# Patient Record
Sex: Female | Born: 1980 | Race: White | Hispanic: No | Marital: Married | Smoking: Never smoker
Health system: Southern US, Community
[De-identification: ages and names within clinical notes are randomized; demographics above are authoritative.]

## PROBLEM LIST (undated history)

## (undated) DIAGNOSIS — Z789 Other specified health status: Secondary | ICD-10-CM

---

## 2019-01-22 ENCOUNTER — Emergency Department (HOSPITAL_COMMUNITY): Payer: Self-pay

## 2019-01-22 ENCOUNTER — Encounter (HOSPITAL_COMMUNITY): Payer: Self-pay

## 2019-01-22 ENCOUNTER — Emergency Department (HOSPITAL_COMMUNITY)
Admission: EM | Admit: 2019-01-22 | Discharge: 2019-01-22 | Disposition: A | Discharge status: Home / Self Care | Payer: Self-pay | Attending: Emergency Medicine | Admitting: Emergency Medicine

## 2019-01-22 ENCOUNTER — Other Ambulatory Visit: Payer: Self-pay

## 2019-01-22 DIAGNOSIS — O034 Incomplete spontaneous abortion without complication: Secondary | ICD-10-CM | POA: Insufficient documentation

## 2019-01-22 DIAGNOSIS — Z3A08 8 weeks gestation of pregnancy: Secondary | ICD-10-CM | POA: Insufficient documentation

## 2019-01-22 DIAGNOSIS — O4690 Antepartum hemorrhage, unspecified, unspecified trimester: Secondary | ICD-10-CM

## 2019-01-22 DIAGNOSIS — R109 Unspecified abdominal pain: Secondary | ICD-10-CM

## 2019-01-22 LAB — CBC WITH DIFFERENTIAL/PLATELET
Abs Immature Granulocytes: 0.01 10*3/uL (ref 0.00–0.07)
Basophils Absolute: 0 10*3/uL (ref 0.0–0.1)
Basophils Relative: 0 %
Eosinophils Absolute: 0.1 10*3/uL (ref 0.0–0.5)
Eosinophils Relative: 3 %
HCT: 38 % (ref 36.0–46.0)
Hemoglobin: 12.2 g/dL (ref 12.0–15.0)
Immature Granulocytes: 0 %
Lymphocytes Relative: 28 %
Lymphs Abs: 1.4 10*3/uL (ref 0.7–4.0)
MCH: 26.3 pg (ref 26.0–34.0)
MCHC: 32.1 g/dL (ref 30.0–36.0)
MCV: 82.1 fL (ref 80.0–100.0)
Monocytes Absolute: 0.3 10*3/uL (ref 0.1–1.0)
Monocytes Relative: 7 %
Neutro Abs: 3 10*3/uL (ref 1.7–7.7)
Neutrophils Relative %: 62 %
Platelets: 239 10*3/uL (ref 150–400)
RBC: 4.63 MIL/uL (ref 3.87–5.11)
RDW: 13.8 % (ref 11.5–15.5)
WBC: 4.9 10*3/uL (ref 4.0–10.5)
nRBC: 0 % (ref 0.0–0.2)

## 2019-01-22 LAB — ABO/RH: ABO/RH(D): O POS

## 2019-01-22 LAB — I-STAT BETA HCG BLOOD, ED (MC, WL, AP ONLY): I-stat hCG, quantitative: 1204.3 m[IU]/mL — ABNORMAL HIGH (ref <5)

## 2019-01-22 NOTE — ED Notes (Signed)
Called pt spouse to come to pt room.

## 2019-01-22 NOTE — ED Provider Notes (Signed)
Patient's pelvic ultrasound showed evidence of fetal demise.  The risk was communicated to the patient via her husband who translated.  That was the patient's preference.  Teach back was used to ensure understanding.  Return precautions given   Lorre Nick, MD 01/22/19 [phone]

## 2019-01-22 NOTE — ED Provider Notes (Signed)
[facility]-EMERGENCY DEPT Provider Note   CSN: [id number] Arrival date & time: 01/22/19  1323    History   Chief Complaint Chief Complaint  Patient presents with  . Vaginal Bleeding  . 2.5 months pregnant    HPI Bonnie Koch is a 38 y.o. female.     Patient is a G7, P6 presenting today with vaginal bleeding that started yesterday in the setting of being 2-1/2 months pregnant.  Patient states the bleeding started spontaneously and has gradually worsened.  She is having abdominal pain and cramping that radiates to her back.  She denies any vaginal discharge and has had the same partner consistently.  She is never had a miscarriage or ectopic pregnancy before.  She has had 3 C-sections and 3 vaginal deliveries.  Her second birth ended in the infant dying shortly after birth but every other pregnancy has gone to term and been normal.  Patient does not take any medications and has followed up with her doctor and done an intake interview but has not had a full evaluation yet.  The history is provided by the patient.  Vaginal Bleeding  Quality:  Dark red and clots Severity:  Moderate Onset quality:  Gradual Duration:  24 hours Timing:  Constant Progression:  Worsening Chronicity:  New Menstrual history:  Regular Possible pregnancy: yes   Context: spontaneously   Relieved by:  None tried Worsened by:  Nothing Ineffective treatments:  None tried Associated symptoms: abdominal pain, back pain and dizziness   Associated symptoms: no dysuria, no nausea and no vaginal discharge   Risk factors: unprotected sex   Risk factors: no hx of ectopic pregnancy, does not have multiple partners and no prior miscarriage     History reviewed. No pertinent past medical history.  There are no active problems to display for this patient.   Past Surgical History:  Procedure Laterality Date  . CESAREAN SECTION     x 3     OB History    Gravida  1   Para      Term       Preterm      AB      Living        SAB      TAB      Ectopic      Multiple      Live Births               Home Medications    Prior to Admission medications   Not on File    Family History Family History  Problem Relation Age of Onset  . Diabetes Mother     Social History Social History   Tobacco Use  . Smoking status: Never Smoker  . Smokeless tobacco: Never Used  Substance Use Topics  . Alcohol use: Never    Frequency: Never  . Drug use: Never     Allergies   Patient has no known allergies.   Review of Systems Review of Systems  Gastrointestinal: Positive for abdominal pain. Negative for nausea.  Genitourinary: Positive for vaginal bleeding. Negative for dysuria and vaginal discharge.  Musculoskeletal: Positive for back pain.  Neurological: Positive for dizziness.  All other systems reviewed and are negative.    Physical Exam Updated Vital Signs BP 125/67 (BP Location: Right Arm)   Pulse 89   Temp 98.2 F (36.8 C) (Oral)   Resp 16   SpO2 100%   Physical Exam Vitals signs and nursing note reviewed.  Constitutional:      General: She is not in acute distress.    Appearance: She is well-developed.  HENT:     Head: Normocephalic and atraumatic.  Eyes:     Pupils: Pupils are equal, round, and reactive to light.  Cardiovascular:     Rate and Rhythm: Normal rate and regular rhythm.     Heart sounds: Normal heart sounds. No murmur. No friction rub.  Pulmonary:     Effort: Pulmonary effort is normal.     Breath sounds: Normal breath sounds. No wheezing or rales.  Abdominal:     General: Bowel sounds are normal. There is no distension.     Palpations: Abdomen is soft.     Tenderness: There is abdominal tenderness in the suprapubic area. There is no right CVA tenderness, left CVA tenderness, guarding or rebound.  Genitourinary:    Vagina: Bleeding present.     Cervix: Cervical bleeding present.     Uterus: Enlarged.       Adnexa: Right adnexa normal and left adnexa normal.     Comments: Os is closed Musculoskeletal: Normal range of motion.        General: No tenderness.     Comments: No edema  Skin:    General: Skin is warm and dry.     Findings: No rash.  Neurological:     General: No focal deficit present.     Mental Status: She is alert and oriented to person, place, and time. Mental status is at baseline.     Cranial Nerves: No cranial nerve deficit.  Psychiatric:        Mood and Affect: Mood normal.        Behavior: Behavior normal.        Thought Content: Thought content normal.      ED Treatments / Results  Labs (all labs ordered are listed, but only abnormal results are displayed) Labs Reviewed  CBC WITH DIFFERENTIAL/PLATELET  URINALYSIS, ROUTINE W REFLEX MICROSCOPIC  I-STAT BETA HCG BLOOD, ED ([facility], [facility], [facility] ONLY)  ABO/RH    EKG None  Radiology No results found.  Procedures Procedures (including critical care time)  Medications Ordered in ED Medications - No data to display   Initial Impression / Assessment and Plan / ED Course  I have reviewed the triage vital signs and the nursing notes.  Pertinent labs & imaging results that were available during my care of the patient were reviewed by me and considered in my medical decision making (see chart for details).       Healthy 38 year old female presenting today with vaginal bleeding and abdominal pain in the setting of pregnancy.  She is currently 2-1/2 months pregnant based on her last menses.  She is having some abdominal discomfort and has vaginal bleeding on exam with some mild clots.  She denies any discharge or concern for STI.  Low suspicion for ovarian torsion.  Need to rule out ectopic versus missed AB or threatened miscarriage.  Rh factor is pending.  hCG, CBC and transvaginal ultrasound pending.  Final Clinical Impressions(s) / ED Diagnoses   Final diagnoses:  None    ED Discharge Orders    None        Gwyneth SproutPlunkett, Ferne Ellingwood, MD 01/22/19 2001

## 2019-01-22 NOTE — ED Notes (Signed)
PT is alert and oriented x 4 and is verbally responsive. Pr is guarding to left side lower abdomen with intermittent moaning. Pt speaks arabic. WALLY in room for interpretation.

## 2019-01-22 NOTE — ED Triage Notes (Signed)
Patient c/o vaginal bleeding, abdominal cramping and back pain since 0400 yesterday. Patient states she is 2 1/2 months pregnant

## 2019-01-22 NOTE — Discharge Instructions (Addendum)
Call your gynecologist tomorrow morning and tell them that you have a fetal demise.  Go to [facility] for severe bleeding, cramping, weakness, or any other problems

## 2019-01-23 ENCOUNTER — Inpatient Hospital Stay (HOSPITAL_COMMUNITY)
Admission: AD | Admit: 2019-01-23 | Discharge: 2019-01-23 | Disposition: A | Discharge status: Home / Self Care | Payer: Self-pay | Attending: Obstetrics & Gynecology | Admitting: Obstetrics & Gynecology

## 2019-01-23 ENCOUNTER — Encounter (HOSPITAL_COMMUNITY): Payer: Self-pay

## 2019-01-23 DIAGNOSIS — Z6740 Type O blood, Rh positive: Secondary | ICD-10-CM | POA: Insufficient documentation

## 2019-01-23 DIAGNOSIS — O039 Complete or unspecified spontaneous abortion without complication: Secondary | ICD-10-CM | POA: Insufficient documentation

## 2019-01-23 MED ORDER — MISOPROSTOL 200 MCG PO TABS
600.0000 ug | ORAL_TABLET | Freq: Once | ORAL | Status: AC
  Administered 2019-01-23: 600 ug via BUCCAL
  Filled 2019-01-23: qty 3

## 2019-01-23 MED ORDER — OXYCODONE-ACETAMINOPHEN 5-325 MG PO TABS: 1.0000 | ORAL_TABLET | ORAL | 0 refills | Status: DC | PRN

## 2019-01-23 MED ORDER — KETOROLAC TROMETHAMINE 60 MG/2ML IM SOLN
60.0000 mg | Freq: Once | INTRAMUSCULAR | Status: AC
  Administered 2019-01-23: 14:00:00 60 mg via INTRAMUSCULAR
  Filled 2019-01-23: qty 2

## 2019-01-23 MED ORDER — IBUPROFEN 600 MG PO TABS: 600.0000 mg | ORAL_TABLET | Freq: Four times a day (QID) | ORAL | 0 refills | Status: DC | PRN

## 2019-01-23 NOTE — MAU Note (Signed)
Pt was seen yesterday at [facility] and was told she had a missed AB. Has been bleeding for 3 days. She didn't understand what she was to do for follow up, says that everything has not passed. Is having bleeding, not any heavier than yesterday, passing some clots.  Pain is 6/10.

## 2019-01-23 NOTE — Discharge Instructions (Signed)
Miscarriage  A miscarriage is the loss of an unborn baby (fetus) before the 20th week of pregnancy.  Follow these instructions at home:  Medicines    · Take over-the-counter and prescription medicines only as told by your doctor.  · If you were prescribed antibiotic medicine, take it as told by your doctor. Do not stop taking the antibiotic even if you start to feel better.  · Do not take NSAIDs unless your doctor says that this is safe for you. NSAIDs include aspirin and ibuprofen. These medicines can cause bleeding.  Activity  · Rest as directed. Ask your doctor what activities are safe for you.  · Have someone help you at home during this time.  General instructions  · Write down how many pads you use each day and how soaked they are.  · Watch the amount of tissue or clumps of blood (blood clots) that you pass from your vagina. Save any large amounts of tissue for your doctor.  · Do not use tampons, douche, or have sex until your doctor approves.  · To help you and your partner with the process of grieving, talk with your doctor or seek counseling.  · When you are ready, meet with your doctor to talk about steps you should take for your health. Also, talk with your doctor about steps to take to have a healthy pregnancy in the future.  · Keep all follow-up visits as told by your doctor. This is important.  Contact a doctor if:  · You have a fever or chills.  · You have vaginal discharge that smells bad.  · You have more bleeding.  Get help right away if:  · You have very bad cramps or pain in your back or belly.  · You pass clumps of blood that are walnut-sized or larger from your vagina.  · You pass tissue that is walnut-sized or larger from your vagina.  · You soak more than 1 regular pad in an hour.  · You get light-headed or weak.  · You faint (pass out).  · You have feelings of sadness that do not go away, or you have thoughts of hurting yourself.  Summary  · A miscarriage is the loss of an unborn baby before  the 20th week of pregnancy.  · Follow your doctor's instructions for home care. Keep all follow-up appointments.  · To help you and your partner with the process of grieving, talk with your doctor or seek counseling.  This information is not intended to replace advice given to you by your health care provider. Make sure you discuss any questions you have with your health care provider.  Document Released: 11/30/2011 Document Revised: 10/13/2016 Document Reviewed: 10/13/2016  Elsevier Interactive Patient Education © 2019 Elsevier Inc.

## 2019-01-23 NOTE — MAU Provider Note (Signed)
History     CSN: [id number]  Arrival date and time: 01/23/19 1137   First Provider Initiated Contact with Patient 01/23/19 1302      No chief complaint on file.  HPI   Ms.Bonnie Koch is a 38 y.o. female [phone] @ 4958w2d here with continued vaginal bleeding.  [facility] and was told that she had a fetal demise @ 8 weeks. She was told to call her OB Dr. The next day for follow up.  She was not able to get an appointment in the office so she returns today with pain and bleeding. "I think I have passed some tissue but not everything".  She does want anything for pain at this time, however would like to know if there is medication that can make the process faster. Denies dizziness.   OB History    Gravida  7   Para  3   Term  3   Preterm      AB  0   Living  5     SAB  0   TAB      Ectopic      Multiple      Live Births  3           History reviewed. No pertinent past medical history.  Past Surgical History:  Procedure Laterality Date  . CESAREAN SECTION     x 3    Family History  Problem Relation Age of Onset  . Diabetes Mother     Social History   Tobacco Use  . Smoking status: Never Smoker  . Smokeless tobacco: Never Used  Substance Use Topics  . Alcohol use: Never    Frequency: Never  . Drug use: Never    Allergies: No Known Allergies  No medications prior to admission.    [place] Ob Less Than 14 Weeks With Ob Transvaginal  Result Date: 01/22/2019 CLINICAL DATA:  Pelvic pain and vaginal bleeding since yesterday in first trimester of pregnancy, quantitative beta HCG = 1204 EXAM: OBSTETRIC <14 WK US AND TRANSVAGINAL OB US TECHNIQUE: Both transabdominal and transvaginal ultrasound examinations were performed for complete evaluation of the gestation as well as the maternal uterus, adnexal regions, and pelvic cul-de-sac. Transvaginal technique was performed to assess early pregnancy. COMPARISON:  None FINDINGS: Intrauterine  gestational sac: Present, single Yolk sac:  Present Embryo:  Present Cardiac Activity: Not visualized Heart Rate: N/A  bpm CRL:  19.4 mm   8 w   3 d                  US EDC: Subchorionic hemorrhage:  None visualized. Maternal uterus/adnexae: RIGHT ovary not visualized. LEFT ovary normal size and morphology, 2.7 x 1.2 x 1.5 cm. No free pelvic fluid or adnexal masses. IMPRESSION: Intrauterine gestational sac is identified containing a yolk sac and a fetal pole. No fetal cardiac activity is identified. Findings meet definitive criteria for failed pregnancy. This follows SRU consensus guidelines: Diagnostic Criteria for Nonviable Pregnancy Early in the First Trimester. Macy Mis Engl J Med [phone];[id number]. Electronically Signed   By: Ulyses SouthwardMark  Boles M.D.   On: 01/22/2019 16:26   Review of Systems  Constitutional: Negative for fever.  Gastrointestinal: Positive for abdominal pain.  Genitourinary: Positive for vaginal bleeding.   Physical Exam   Blood pressure (!) 115/59, pulse 72, temperature 98.3 F (36.8 C), temperature source Oral, resp. rate 16, weight 87.1 kg, SpO2 100 %.  Physical Exam  Constitutional: She  appears well-developed and well-nourished. No distress.  HENT:  Head: Normocephalic.  GI: Soft. She exhibits no distension. There is no abdominal tenderness. There is no rebound.  Genitourinary:    Genitourinary Comments: Cervix anterior, fingertip. Small amount of dark red blood noted.    Skin: Skin is warm. She is not diaphoretic.  Psychiatric: Her behavior is normal.   MAU Course  Procedures  None  MDM  Early Intrauterine Pregnancy Failure Protocol X  Documented intrauterine pregnancy failure less than or equal to [redacted] weeks gestation  X  No serious current illness  X  Baseline Hgb greater than or equal to 10g/dl  X  Patient has easily accessible transportation to the hospital  X  Clear preference  X  Practitioner/physician deems patient reliable  X  Counseling by practitioner or physician   X  Patient education by RN  X  Consent form signed  NA     Rho-Gam given by RN if indicated  X  Medication dispensed  X  Cytotec 600 mcg buccal in MAU X   Ibuprofen 600 mg 1 tablet by mouth every 6 hours as needed #30 - prescribed  X   Percocet mg by mouth every 4 to 6 hours as needed - prescribed   Reviewed with pt cytotec procedure.  Pt verbalizes that she lives close to the hospital and has transportation readily available.  Pt appears reliable and verbalizes understanding and agrees with plan of care   Assessment and Plan   A:  1. SAB (spontaneous abortion)   2. Type O blood, Rh positive     P:  Discharge home in stable condition She will need follow up in the office in 1 week for a Quant.  Strict return precautions Return to MAU if symptoms worsen Rx: Percocet, ibuprofen    [place] Yepiz, Harolyn Rutherford, NP 01/25/2019 2:07 PM

## 2019-02-06 ENCOUNTER — Other Ambulatory Visit: Payer: Self-pay

## 2019-02-16 ENCOUNTER — Other Ambulatory Visit: Payer: Self-pay

## 2019-02-16 ENCOUNTER — Ambulatory Visit (INDEPENDENT_AMBULATORY_CARE_PROVIDER_SITE_OTHER): Payer: Self-pay | Admitting: Obstetrics and Gynecology

## 2019-02-16 DIAGNOSIS — O039 Complete or unspecified spontaneous abortion without complication: Secondary | ICD-10-CM

## 2019-02-16 NOTE — Progress Notes (Signed)
TELEHEALTH VIRTUAL GYNECOLOGY VISIT ENCOUNTER NOTE  I connected with Aune Heiberger on 02/16/19 at  3:55 PM EDT by telephone at home and verified that I am speaking with the correct person using two identifiers.  Arabic interpretor used.   I discussed the limitations, risks, security and privacy concerns of performing an evaluation and management service by telephone and the availability of in person appointments. I also discussed with the patient that there may be a patient responsible charge related to this service. The patient expressed understanding and agreed to proceed.   History:  Shawta Spearin is a 38 y.o. G34P3005 female being evaluated today for f/u for recent SAB. She was seen in MAU and was given Cytotec in MAU. She had bleeding for a few days and now the bleeding has stopped.  She denies any abnormal vaginal discharge, bleeding, pelvic pain or other concerns.       No past medical history on file. Past Surgical History:  Procedure Laterality Date   CESAREAN SECTION     x 3   The following portions of the patient's history were reviewed and updated as appropriate: allergies, current medications, past family history, past medical history, past social history, past surgical history and problem list.    Review of Systems:  Pertinent items noted in HPI and remainder of comprehensive ROS otherwise negative.  Physical Exam:   General:  Alert, oriented and cooperative.   Mental Status: Normal mood and affect perceived. Normal judgment and thought content.  Physical exam deferred due to nature of the encounter  Labs and Imaging No results found for this or any previous visit (from the past 336 hour(s)). US Ob Less Than 14 Weeks With Ob Transvaginal  Result Date: 01/22/2019 CLINICAL DATA:  Pelvic pain and vaginal bleeding since yesterday in first trimester of pregnancy, quantitative beta HCG = 1204 EXAM: OBSTETRIC <14 WK [place] AND TRANSVAGINAL OB US TECHNIQUE: Both  transabdominal and transvaginal ultrasound examinations were performed for complete evaluation of the gestation as well as the maternal uterus, adnexal regions, and pelvic cul-de-sac. Transvaginal technique was performed to assess early pregnancy. COMPARISON:  None FINDINGS: Intrauterine gestational sac: Present, single Yolk sac:  Present Embryo:  Present Cardiac Activity: Not visualized Heart Rate: N/A  bpm CRL:  19.4 mm   8 w   3 d                  [place] EDC: Subchorionic hemorrhage:  None visualized. Maternal uterus/adnexae: RIGHT ovary not visualized. LEFT ovary normal size and morphology, 2.7 x 1.2 x 1.5 cm. No free pelvic fluid or adnexal masses. IMPRESSION: Intrauterine gestational sac is identified containing a yolk sac and a fetal pole. No fetal cardiac activity is identified. Findings meet definitive criteria for failed pregnancy. This follows SRU consensus guidelines: Diagnostic Criteria for Nonviable Pregnancy Early in the First Trimester. Macy Mis J Med [phone]. Electronically Signed   By: Ulyses Southward M.D.   On: 01/22/2019 16:26      Assessment and Plan:   1. SAB (spontaneous abortion)  - Beta hCG quant (ref lab); Future - Patient to come to the office tomorrow AM for beta hcg level.  - O positive blood type.   I discussed the assessment and treatment plan with the patient. The patient was provided an opportunity to ask questions and all were answered. The patient agreed with the plan and demonstrated an understanding of the instructions.   The patient was advised to call back or seek an  in-person evaluation/go to the ED if the symptoms worsen or if the condition fails to improve as anticipated.  I provided 12 minutes of non-face-to-face time during this encounter.   Venia CarbonJennifer Mikaelah Trostle, NP [facility], [facility]

## 2019-02-17 ENCOUNTER — Other Ambulatory Visit: Payer: Self-pay

## 2019-02-17 ENCOUNTER — Other Ambulatory Visit (INDEPENDENT_AMBULATORY_CARE_PROVIDER_SITE_OTHER): Payer: Self-pay | Admitting: *Deleted

## 2019-02-17 DIAGNOSIS — O039 Complete or unspecified spontaneous abortion without complication: Secondary | ICD-10-CM

## 2019-02-17 NOTE — Progress Notes (Signed)
While having lab draw , pt reported having pain. I spoke with her using video interpreter [facility] [phone]. Pt reports having pain under Lt breast, radiating to Lt flank and back pain ever since having the miscarriage. The pain has not changed. She also feels that her abdomen is bloated. Pt states that she was given medication on 5/4 during ED visit. (3 pills an 1 injection) She passed tissue on 5/6 followed by bleeding x15-16 days. She has no bleeding now. Nurse consult completed with Dr. Alysia Penna.  Pt was advised she may take Tylenol or ibuprofen for mild pain and if needed she may take oxycodone as previously prescribed for stronger pain. Pt should go to [facility] if she experiences severe pain. Pt inquired when she could attempt to become pregnant and was told she should wait until after at least one menstrual cycle with period. She also wanted to know if she will need follow up imaging and was informed that decision will be made based on results of lab test from today. She will be notified of plan of care once her results have been reviewed by the provider. Pt voiced understanding of all information and instructions given.

## 2019-02-18 LAB — BETA HCG QUANT (REF LAB): hCG Quant: 3 m[IU]/mL

## 2019-08-14 ENCOUNTER — Other Ambulatory Visit: Payer: Self-pay

## 2019-08-14 ENCOUNTER — Emergency Department (HOSPITAL_COMMUNITY): Payer: Medicaid Other

## 2019-08-14 ENCOUNTER — Encounter (HOSPITAL_COMMUNITY): Payer: Self-pay

## 2019-08-14 ENCOUNTER — Emergency Department (HOSPITAL_COMMUNITY)
Admission: EM | Admit: 2019-08-14 | Discharge: 2019-08-14 | Disposition: A | Discharge status: Home / Self Care | Payer: Medicaid Other | Attending: Emergency Medicine | Admitting: Emergency Medicine

## 2019-08-14 DIAGNOSIS — Z3A01 Less than 8 weeks gestation of pregnancy: Secondary | ICD-10-CM | POA: Diagnosis not present

## 2019-08-14 DIAGNOSIS — O23591 Infection of other part of genital tract in pregnancy, first trimester: Secondary | ICD-10-CM | POA: Diagnosis not present

## 2019-08-14 DIAGNOSIS — Z3201 Encounter for pregnancy test, result positive: Secondary | ICD-10-CM

## 2019-08-14 DIAGNOSIS — B9689 Other specified bacterial agents as the cause of diseases classified elsewhere: Secondary | ICD-10-CM | POA: Diagnosis not present

## 2019-08-14 DIAGNOSIS — O4691 Antepartum hemorrhage, unspecified, first trimester: Secondary | ICD-10-CM | POA: Diagnosis present

## 2019-08-14 DIAGNOSIS — O200 Threatened abortion: Secondary | ICD-10-CM | POA: Insufficient documentation

## 2019-08-14 DIAGNOSIS — N760 Acute vaginitis: Secondary | ICD-10-CM

## 2019-08-14 DIAGNOSIS — N939 Abnormal uterine and vaginal bleeding, unspecified: Secondary | ICD-10-CM

## 2019-08-14 LAB — COMPREHENSIVE METABOLIC PANEL
ALT: 15 U/L (ref 0–44)
AST: 13 U/L — ABNORMAL LOW (ref 15–41)
Albumin: 4.3 g/dL (ref 3.5–5.0)
Alkaline Phosphatase: 74 U/L (ref 38–126)
Anion gap: 8 (ref 5–15)
BUN: 13 mg/dL (ref 6–20)
CO2: 22 mmol/L (ref 22–32)
Calcium: 9 mg/dL (ref 8.9–10.3)
Chloride: 106 mmol/L (ref 98–111)
Creatinine, Ser: 0.65 mg/dL (ref 0.44–1.00)
GFR calc Af Amer: >60 mL/min (ref >60)
GFR calc non Af Amer: >60 mL/min (ref >60)
Glucose, Bld: 102 mg/dL — ABNORMAL HIGH (ref 70–99)
Potassium: 3.8 mmol/L (ref 3.5–5.1)
Sodium: 136 mmol/L (ref 135–145)
Total Bilirubin: 0.8 mg/dL (ref 0.3–1.2)
Total Protein: 7.6 g/dL (ref 6.5–8.1)

## 2019-08-14 LAB — CBC WITH DIFFERENTIAL/PLATELET
Abs Immature Granulocytes: 0.01 10*3/uL (ref 0.00–0.07)
Basophils Absolute: 0 10*3/uL (ref 0.0–0.1)
Basophils Relative: 1 %
Eosinophils Absolute: 0.1 10*3/uL (ref 0.0–0.5)
Eosinophils Relative: 3 %
HCT: 37.5 % (ref 36.0–46.0)
Hemoglobin: 11.9 g/dL — ABNORMAL LOW (ref 12.0–15.0)
Immature Granulocytes: 0 %
Lymphocytes Relative: 34 %
Lymphs Abs: 1.6 10*3/uL (ref 0.7–4.0)
MCH: 26.4 pg (ref 26.0–34.0)
MCHC: 31.7 g/dL (ref 30.0–36.0)
MCV: 83.1 fL (ref 80.0–100.0)
Monocytes Absolute: 0.3 10*3/uL (ref 0.1–1.0)
Monocytes Relative: 6 %
Neutro Abs: 2.7 10*3/uL (ref 1.7–7.7)
Neutrophils Relative %: 56 %
Platelets: 245 10*3/uL (ref 150–400)
RBC: 4.51 MIL/uL (ref 3.87–5.11)
RDW: 13.5 % (ref 11.5–15.5)
WBC: 4.8 10*3/uL (ref 4.0–10.5)
nRBC: 0 % (ref 0.0–0.2)

## 2019-08-14 LAB — WET PREP, GENITAL
Sperm: NONE SEEN
Trich, Wet Prep: NONE SEEN

## 2019-08-14 LAB — I-STAT BETA HCG BLOOD, ED (MC, WL, AP ONLY): I-stat hCG, quantitative: >2000 m[IU]/mL — ABNORMAL HIGH (ref <5)

## 2019-08-14 LAB — HCG, QUANTITATIVE, PREGNANCY: hCG, Beta Chain, Quant, S: 7688 m[IU]/mL — ABNORMAL HIGH (ref <5)

## 2019-08-14 MED ORDER — METRONIDAZOLE 500 MG PO TABS
500.0000 mg | ORAL_TABLET | Freq: Once | ORAL | Status: AC
  Administered 2019-08-14: 500 mg via ORAL
  Filled 2019-08-14: qty 1

## 2019-08-14 MED ORDER — METRONIDAZOLE 500 MG PO TABS: 500.0000 mg | ORAL_TABLET | Freq: Two times a day (BID) | ORAL | 0 refills | Status: DC

## 2019-08-14 MED ORDER — ACETAMINOPHEN 500 MG PO TABS
1000.0000 mg | ORAL_TABLET | Freq: Once | ORAL | Status: AC
  Administered 2019-08-14: 20:00:00 1000 mg via ORAL
  Filled 2019-08-14: qty 2

## 2019-08-14 NOTE — Discharge Instructions (Addendum)
Please take Tylenol (acetaminophen) to relieve your pain.  You may take tylenol, up to 1,000 mg (two extra strength pills).  Do not take more than 3,000 mg tylenol in a 24 hour period.  Please check all medication labels as many medications such as pain and cold medications may contain tylenol. Please do not drink alcohol while taking this medication.   You may have diarrhea from the antibiotics.  It is very important that you continue to take the antibiotics even if you get diarrhea unless a medical professional tells you that you may stop taking them.  If you stop too early the bacteria you are being treated for will become stronger and you may need different, more powerful antibiotics that have more side effects and worsening diarrhea.  Please stay well hydrated and consider probiotics as they may decrease the severity of your diarrhea.  Please be aware that if you take any hormonal contraception (birth control pills, nexplanon, the ring, etc) that your birth control will not work while you are taking antibiotics and you need to use back up protection as directed on the birth control medication information insert.

## 2019-08-14 NOTE — ED Provider Notes (Signed)
[facility] DEPT Provider Note   CSN: [id number] Arrival date & time: 08/14/19  1017     History   Chief Complaint Chief Complaint  Patient presents with   Vaginal Bleeding    Pregnant    HPI Bonnie Koch is a 38 y.o. female G8, P5, LMP August 23 13+1 who presents today for evaluation of vaginal bleeding since last night.  She reports that it initially started as a scant brown occasional drainage.  She has been using thin pads and states that today she has gone through 3.  Yesterday it was brown, however over the past day it has become clots.  She reports mild generalized pelvic pain.  She denies any dysuria increased frequency or urgency.  She has not attempted anything for the pain. He had a previous miscarriage in May of this year.  All patient encounters were performed through professional Arabic speaking medical interpreter.     HPI  History reviewed. No pertinent past medical history.  Patient Active Problem List   Diagnosis Date Noted   SAB (spontaneous abortion) 02/16/2019    Past Surgical History:  Procedure Laterality Date   CESAREAN SECTION     x 3     OB History    Gravida  8   Para  3   Term  3   Preterm      AB  1   Living  5     SAB  1   TAB      Ectopic      Multiple      Live Births  3            Home Medications    Prior to Admission medications   Medication Sig Start Date End Date Taking? Authorizing Provider  ibuprofen (ADVIL) 600 MG tablet Take 1 tablet (600 mg total) by mouth every 6 (six) hours as needed for mild pain. Patient not taking: Reported on 08/14/2019 01/23/19   Rasch, Anderson Malta I, NP  metroNIDAZOLE (FLAGYL) 500 MG tablet Take 1 tablet (500 mg total) by mouth 2 (two) times daily. 08/14/19   Lorin Glass, PA-C  oxyCODONE-acetaminophen (PERCOCET) 5-325 MG tablet Take 1-2 tablets by mouth every 4 (four) hours as needed for severe pain. Patient not taking: Reported on  08/14/2019 01/23/19 01/23/20  Rasch, Artist Pais, NP    Family History Family History  Problem Relation Age of Onset   Diabetes Mother     Social History Social History   Tobacco Use   Smoking status: Never Smoker   Smokeless tobacco: Never Used  Substance Use Topics   Alcohol use: Never    Frequency: Never   Drug use: Never     Allergies   Patient has no known allergies.   Review of Systems Review of Systems  Constitutional: Negative for chills and fever.  Respiratory: Negative for cough and shortness of breath.   Cardiovascular: Negative for chest pain.  Genitourinary: Positive for pelvic pain and vaginal bleeding. Negative for dysuria.  Musculoskeletal: Negative for back pain and neck pain.  All other systems reviewed and are negative.    Physical Exam Updated Vital Signs BP 113/84 (BP Location: Right Arm)    Pulse 86    Temp (!) 97.4 F (36.3 C)    Resp 17    SpO2 99%   Physical Exam Vitals signs and nursing note reviewed. Exam conducted with a chaperone present (Patient's primary RN, ED tech, both female.).  Constitutional:  General: She is not in acute distress.    Appearance: She is well-developed. She is not diaphoretic.  HENT:     Head: Normocephalic and atraumatic.  Eyes:     General: No scleral icterus.       Right eye: No discharge.        Left eye: No discharge.     Conjunctiva/sclera: Conjunctivae normal.  Neck:     Musculoskeletal: Normal range of motion.  Cardiovascular:     Rate and Rhythm: Normal rate and regular rhythm.  Pulmonary:     Effort: Pulmonary effort is normal. No respiratory distress.     Breath sounds: No stridor.  Abdominal:     General: There is no distension.  Genitourinary:    Uterus: Not tender.      Adnexa:        Right: No mass, tenderness or fullness.         Left: No mass, tenderness or fullness.       Comments: Normal external female genitalia.  There is a moderate amount of blood in the vaginal canal.   Cervical os is closed.  When bleeding from cervix is wiped away it does not rapidly reaccumulate.  Musculoskeletal:        General: No deformity.  Skin:    General: Skin is warm and dry.  Neurological:     Mental Status: She is alert.     Motor: No abnormal muscle tone.  Psychiatric:     Comments: Mood and behavior are appropriate for situation.       ED Treatments / Results  Labs (all labs ordered are listed, but only abnormal results are displayed) Labs Reviewed  WET PREP, GENITAL - Abnormal; Notable for the following components:      Result Value   Yeast Wet Prep HPF POC PRESENT (*)    Clue Cells Wet Prep HPF POC PRESENT (*)    WBC, Wet Prep HPF POC FEW (*)    All other components within normal limits  CBC WITH DIFFERENTIAL/PLATELET - Abnormal; Notable for the following components:   Hemoglobin 11.9 (*)    All other components within normal limits  COMPREHENSIVE METABOLIC PANEL - Abnormal; Notable for the following components:   Glucose, Bld 102 (*)    AST 13 (*)    All other components within normal limits  HCG, QUANTITATIVE, PREGNANCY - Abnormal; Notable for the following components:   hCG, Beta Chain, Quant, S 4,0987,688 (*)    All other components within normal limits  I-STAT BETA HCG BLOOD, ED ([facility], [facility], [facility] ONLY) - Abnormal; Notable for the following components:   I-stat hCG, quantitative >2,000.0 (*)    All other components within normal limits  GC/CHLAMYDIA PROBE AMP ([place]) NOT AT [facility]    EKG None  Radiology Koreas Ob Less Than 14 Weeks With Ob Transvaginal  Result Date: 08/14/2019 CLINICAL DATA:  Vaginal bleeding EXAM: OBSTETRIC <14 WK US AND TRANSVAGINAL OB US TECHNIQUE: Both transabdominal and transvaginal ultrasound examinations were performed for complete evaluation of the gestation as well as the maternal uterus, adnexal regions, and pelvic cul-de-sac. Transvaginal technique was performed to assess early pregnancy. COMPARISON:  None. FINDINGS: Intrauterine  gestational sac: Single Yolk sac:  Not Visualized. Embryo:  Not Visualized. Cardiac Activity: Not Visualized. MSD: 23.3 mm   7 w   0 d Subchorionic hemorrhage: Small subchorionic hemorrhage Maternal uterus/adnexae: Left ovary measures 2.1 x 1.5 x 1.8 cm. The right ovary is nonvisualized. IMPRESSION: 1. Single intrauterine  gestational sac with mean sac diameter of 23.3 mm but negative for embryo or yolk sac. Findings are suspicious but not yet definitive for failed pregnancy. Recommend follow-up US in 10-14 days for definitive diagnosis. This recommendation follows SRU consensus guidelines: Diagnostic Criteria for Nonviable Pregnancy Early in the First Trimester. Malva Limes Med 2013; 119:1478-29. 2. Small subchorionic hemorrhage Electronically Signed   By: Jasmine Pang M.D.   On: 08/14/2019 15:27    Procedures Procedures (including critical care time)  Medications Ordered in ED Medications  acetaminophen (TYLENOL) tablet 1,000 mg (1,000 mg Oral Given 08/14/19 1930)  metroNIDAZOLE (FLAGYL) tablet 500 mg (500 mg Oral Given 08/14/19 1930)     Initial Impression / Assessment and Plan / ED Course  I have reviewed the triage vital signs and the nursing notes.  Pertinent labs & imaging results that were available during my care of the patient were reviewed by me and considered in my medical decision making (see chart for details).  Clinical Course as of Aug 14 40  Mon Aug 14, 2019  1557 No fetus visualized.   US OB LESS THAN 14 WEEKS WITH OB TRANSVAGINAL [EH]  1700 Spoke with on call OB/GYN.  They recommend follow up in one week.     [EH]  1827 Spoke with lab, they report there are no orders in for wet prep.  Lab has been ordered since 1700.  They state will run now.     [EH]  1859 Called lab, they report clue cells, yeast, WBC    [EH]    Clinical Course User Index [EH] Cristina Gong, PA-C      Patient is a 38 year old G8, P5 currently pregnant presents today for concern of  vaginal bleeding in the setting of pregnancy.  By LMP she is 13 weeks and 1 day pregnant.  She started having vaginal bleeding yesterday.  Chart review shows that her blood type is O+, not a RhoGam candidate.  Pelvic ultrasound was obtained showing a single intrauterine gestational sac without embryo or yolk sac visualized.  She appears to be measuring approximately 7 weeks based on structures present.  Pelvic exam was performed, cervical opening is closed.  Wet prep and GC testing were sent.  Wet prep is positive for clue cells, yeast, and white blood cells.  Given that she appears to be miscarrying, however ultrasound is not definitive, will hold treatment for yeast as she would need topical treatment which would be much less effective given vaginal bleeding, and she has not been having reported abnormal vaginal discharge.  Wet prep is positive for clue cells.  She is given a prescription for Flagyl.  I spoke with on-call OB/GYN who recommended follow-up as an outpatient in 1 week.  The results and plan were discussed with patient who stated her understanding.  Return precautions were discussed with patient who states their understanding.  At the time of discharge patient denied any unaddressed complaints or concerns.  Patient is agreeable for discharge home.   Final Clinical Impressions(s) / ED Diagnoses   Final diagnoses:  Miscarriage, threatened, early pregnancy  BV (bacterial vaginosis)    ED Discharge Orders         Ordered    metroNIDAZOLE (FLAGYL) 500 MG tablet  2 times daily     08/14/19 1923           Norman Clay 08/15/19 0046    Glynn Octave, MD 08/15/19 0139

## 2019-08-14 NOTE — ED Triage Notes (Addendum)
Pt states that since yesterday, she has had vaginal bleeding. Pt states that it started off very light, but has slightly increased. Pt states that it was brown, but now there are blood clots. Pt endorses some pain and cramping.  This is pt's 6 pregnancy, she had a miscarriage 5 months ago, has 5 children.  EDD 02/18/20  Interpreter Princess Perna [phone].

## 2019-08-15 LAB — GC/CHLAMYDIA PROBE AMP (~~LOC~~) NOT AT ARMC
Chlamydia: NEGATIVE
Neisseria Gonorrhea: NEGATIVE

## 2019-08-16 ENCOUNTER — Inpatient Hospital Stay (HOSPITAL_COMMUNITY): Payer: Medicaid Other

## 2019-08-16 ENCOUNTER — Encounter (HOSPITAL_COMMUNITY): Payer: Self-pay

## 2019-08-16 ENCOUNTER — Other Ambulatory Visit: Payer: Self-pay

## 2019-08-16 ENCOUNTER — Inpatient Hospital Stay (HOSPITAL_COMMUNITY)
Admission: AD | Admit: 2019-08-16 | Discharge: 2019-08-16 | Disposition: A | Discharge status: Home / Self Care | Payer: Medicaid Other | Attending: Obstetrics and Gynecology | Admitting: Obstetrics and Gynecology

## 2019-08-16 DIAGNOSIS — Z3689 Encounter for other specified antenatal screening: Secondary | ICD-10-CM | POA: Diagnosis not present

## 2019-08-16 DIAGNOSIS — Z369 Encounter for antenatal screening, unspecified: Secondary | ICD-10-CM | POA: Diagnosis not present

## 2019-08-16 DIAGNOSIS — O4691 Antepartum hemorrhage, unspecified, first trimester: Secondary | ICD-10-CM | POA: Diagnosis present

## 2019-08-16 DIAGNOSIS — Z6790 Unspecified blood type, Rh positive: Secondary | ICD-10-CM | POA: Diagnosis not present

## 2019-08-16 DIAGNOSIS — O26891 Other specified pregnancy related conditions, first trimester: Secondary | ICD-10-CM | POA: Insufficient documentation

## 2019-08-16 DIAGNOSIS — Z3A13 13 weeks gestation of pregnancy: Secondary | ICD-10-CM | POA: Insufficient documentation

## 2019-08-16 DIAGNOSIS — O3680X0 Pregnancy with inconclusive fetal viability, not applicable or unspecified: Secondary | ICD-10-CM | POA: Insufficient documentation

## 2019-08-16 HISTORY — DX: Other specified health status: Z78.9

## 2019-08-16 LAB — CBC
HCT: 34.6 % — ABNORMAL LOW (ref 36.0–46.0)
Hemoglobin: 11.3 g/dL — ABNORMAL LOW (ref 12.0–15.0)
MCH: 26.2 pg (ref 26.0–34.0)
MCHC: 32.7 g/dL (ref 30.0–36.0)
MCV: 80.3 fL (ref 80.0–100.0)
Platelets: 242 10*3/uL (ref 150–400)
RBC: 4.31 MIL/uL (ref 3.87–5.11)
RDW: 13.2 % (ref 11.5–15.5)
WBC: 4.6 10*3/uL (ref 4.0–10.5)
nRBC: 0 % (ref 0.0–0.2)

## 2019-08-16 LAB — HCG, QUANTITATIVE, PREGNANCY: hCG, Beta Chain, Quant, S: 5042 m[IU]/mL — ABNORMAL HIGH (ref <5)

## 2019-08-16 MED ORDER — PRENATAL VITAMIN 27-0.8 MG PO TABS: 1.0000 | ORAL_TABLET | Freq: Every day | ORAL | 11 refills | Status: AC

## 2019-08-16 NOTE — MAU Provider Note (Signed)
History     CSN: [id number]  Arrival date and time: 08/16/19 1113   First Provider Initiated Contact with Patient 08/16/19 1200      Chief Complaint  Patient presents with  . Vaginal Bleeding   38 y.o. [id number] @13 .3 by LMP presenting with VB. Bleeding started 4 days ago. She only sees the blood when she wipes. Having intermittent lower abdominal cramping. Rates 5/10. Has not taken anything for it. She was seen in ED 2 days ago and found to have IUGS measuring 93mm with no YS or FP.    OB History    Gravida  8   Para  3   Term  3   Preterm      AB  1   Living  5     SAB  1   TAB      Ectopic      Multiple      Live Births  3           Past Medical History:  Diagnosis Date  . Medical history non-contributory     Past Surgical History:  Procedure Laterality Date  . CESAREAN SECTION     x 3    Family History  Problem Relation Age of Onset  . Diabetes Mother     Social History   Tobacco Use  . Smoking status: Never Smoker  . Smokeless tobacco: Never Used  Substance Use Topics  . Alcohol use: Never    Frequency: Never  . Drug use: Never    Allergies: No Known Allergies  Medications Prior to Admission  Medication Sig Dispense Refill Last Dose  . metroNIDAZOLE (FLAGYL) 500 MG tablet Take 1 tablet (500 mg total) by mouth 2 (two) times daily. 14 tablet 0 08/16/2019 at Unknown time  . ibuprofen (ADVIL) 600 MG tablet Take 1 tablet (600 mg total) by mouth every 6 (six) hours as needed for mild pain. (Patient not taking: Reported on 08/14/2019) 30 tablet 0  at not taking  . oxyCODONE-acetaminophen (PERCOCET) 5-325 MG tablet Take 1-2 tablets by mouth every 4 (four) hours as needed for severe pain. 6 tablet 0  at not taking    Review of Systems  Constitutional: Negative for chills and fever.  Gastrointestinal: Positive for abdominal pain.  Genitourinary: Positive for vaginal bleeding.   Physical Exam   Blood pressure 108/62, pulse 77, resp.  rate 16, height 5\' 2"  (1.575 m), weight 84.7 kg, SpO2 100 %.  Physical Exam  Nursing note and vitals reviewed. Constitutional: She is oriented to person, place, and time. She appears well-developed and well-nourished. No distress.  HENT:  Head: Normocephalic and atraumatic.  Neck: Normal range of motion.  Cardiovascular: Normal rate.  Respiratory: Effort normal. No respiratory distress.  GI: Soft. She exhibits no distension and no mass. There is no abdominal tenderness. There is no rebound and no guarding.  Genitourinary:    Genitourinary Comments: External: no lesions or erythema Vagina: rugated, pink, moist, scant dark bloody discharge Uterus: non enlarged, anteverted, non tender, no CMT Adnexae: no masses, no tenderness left, no tenderness right Cervix closed    Musculoskeletal: Normal range of motion.  Neurological: She is alert and oriented to person, place, and time.  Skin: Skin is warm and dry.  Psychiatric: She has a normal mood and affect.   Results for orders placed or performed during the hospital encounter of 08/16/19 (from the past 24 hour(s))  CBC     Status: Abnormal   Collection Time:  08/16/19 12:41 PM  Result Value Ref Range   WBC 4.6 4.0 - 10.5 K/uL   RBC 4.31 3.87 - 5.11 MIL/uL   Hemoglobin 11.3 (L) 12.0 - 15.0 g/dL   HCT 34.6 (L) 36.0 - 46.0 %   MCV 80.3 80.0 - 100.0 fL   MCH 26.2 26.0 - 34.0 pg   MCHC 32.7 30.0 - 36.0 g/dL   RDW 13.2 11.5 - 15.5 %   Platelets 242 150 - 400 K/uL   nRBC 0.0 0.0 - 0.2 %  hCG, quantitative, pregnancy     Status: Abnormal   Collection Time: 08/16/19 12:41 PM  Result Value Ref Range   hCG, Beta Chain, Quant, S 5,042 (H) <5 mIU/mL   US Ob Transvaginal  Result Date: 08/16/2019 CLINICAL DATA:  Pregnant, vaginal bleeding EXAM: TRANSVAGINAL OB ULTRASOUND TECHNIQUE: Transvaginal ultrasound was performed for complete evaluation of the gestation as well as the maternal uterus, adnexal regions, and pelvic cul-de-sac. COMPARISON:   08/14/2019 FINDINGS: Intrauterine gestational sac: Single, irregular with angular margins, located in the lower uterine segment Yolk sac:  Not Visualized. Embryo:  Not Visualized. MSD: 18.7 mm   6 w   5 d Subchorionic hemorrhage:  None visualized. Maternal uterus/adnexae: Bilateral ovaries are within normal limits. No free fluid. IMPRESSION: Single irregular gestational sac located within the lower uterine segment. No yolk sac or fetal pole. No appropriate growth from prior study. This appearance continues to be highly suspicious for failed pregnancy. Findings are suspicious but not yet definitive for failed pregnancy. Recommend follow-up US in 10-14 days for definitive diagnosis. This recommendation follows SRU consensus guidelines: Diagnostic Criteria for Nonviable Pregnancy Early in the First Trimester. Alta Corning Med 2013; 034:7425-95. Electronically Signed   By: Julian Hy M.D.   On: 08/16/2019 13:43   MAU Course  Procedures  MDM Labs and [place] ordered and reviewed. Suspect failed pregnancy based on [place] and falling quant but cannot r/o ectopic or early pregnancy. Discussed findings with pt. Pt asking multiple times why this has happened again (last SAB earlier this year). I told her I couldn't tell her definitively why but many early pregnancy losses can be attributed to chromosomal abnormalities. She is adamant she wants to know why this keeps happening. I recommend she f/u with OBGYN for further evaluation before attempting another pregnancy although there may not be an answer found. Will rpt qhcg in 2 days. Pt request PNV, Rx sent. Stable for discharge home.  Assessment and Plan   1. Pregnancy, location unknown   2. Blood type, Rh positive    Discharge home Follow up in MAU for rpt qhcg on 11/27 around noon Strict return precautions Rx PNV  Allergies as of 08/16/2019   No Known Allergies     Medication List    STOP taking these medications   ibuprofen 600 MG tablet Commonly known  as: ADVIL   metroNIDAZOLE 500 MG tablet Commonly known as: FLAGYL   oxyCODONE-acetaminophen 5-325 MG tablet Commonly known as: Percocet     TAKE these medications   Prenatal Vitamin 27-0.8 MG Tabs Take 1 tablet by mouth daily.      Interpreter present for all interactions  Julianne Handler, CNM 08/16/2019, 12:29 PM

## 2019-08-16 NOTE — Discharge Instructions (Signed)
Vaginal Bleeding During Pregnancy, First Trimester ° °A small amount of bleeding (spotting) from the vagina is common during early pregnancy. Sometimes the bleeding is normal and does not cause problems. At other times, though, bleeding may be a sign of something serious. Tell your doctor about any bleeding from your vagina right away. °Follow these instructions at home: °Activity °· Follow your doctor's instructions about how active you can be. °· If needed, make plans for someone to help with your normal activities. °· Do not have sex or orgasms until your doctor says that this is safe. °General instructions °· Take over-the-counter and prescription medicines only as told by your doctor. °· Watch your condition for any changes. °· Write down: °? The number of pads you use each day. °? How often you change pads. °? How soaked (saturated) your pads are. °· Do not use tampons. °· Do not douche. °· If you pass any tissue from your vagina, save it to show to your doctor. °· Keep all follow-up visits as told by your doctor. This is important. °Contact a doctor if: °· You have vaginal bleeding at any time while you are pregnant. °· You have cramps. °· You have a fever. °Get help right away if: °· You have very bad cramps in your back or belly (abdomen). °· You pass large clots or a lot of tissue from your vagina. °· Your bleeding gets worse. °· You feel light-headed. °· You feel weak. °· You pass out (faint). °· You have chills. °· You are leaking fluid from your vagina. °· You have a gush of fluid from your vagina. °Summary °· Sometimes vaginal bleeding during pregnancy is normal and does not cause problems. At other times, bleeding may be a sign of something serious. °· Tell your doctor about any bleeding from your vagina right away. °· Follow your doctor's instructions about how active you can be. You may need someone to help you with your normal activities. °This information is not intended to replace advice given to  you by your health care provider. Make sure you discuss any questions you have with your health care provider. °Document Released: 01/22/2014 Document Revised: 12/27/2018 Document Reviewed: 12/09/2016 °Elsevier Patient Education © 2020 Elsevier Inc. ° °

## 2019-08-16 NOTE — MAU Note (Signed)
G8P5 at [redacted]w[redacted]d. Pt reports vaginal bleeding since 11/22. Was seen at [facility] ED on 11/23. Michela Pitcher that they told her to come here today and not wait for appointment on 11/30. She states that bleeding is worse. Is seeing more bleeding w/clots. Reporting cramping and back pain - rating 5/10. Denies fever or nausea/vomiting. Had previous miscarriage in May.

## 2019-08-18 ENCOUNTER — Inpatient Hospital Stay (HOSPITAL_COMMUNITY)
Admission: AD | Admit: 2019-08-18 | Discharge: 2019-08-18 | Disposition: A | Discharge status: Home / Self Care | Payer: Medicaid Other | Attending: Obstetrics and Gynecology | Admitting: Obstetrics and Gynecology

## 2019-08-18 ENCOUNTER — Other Ambulatory Visit: Payer: Self-pay

## 2019-08-18 DIAGNOSIS — O09521 Supervision of elderly multigravida, first trimester: Secondary | ICD-10-CM | POA: Insufficient documentation

## 2019-08-18 DIAGNOSIS — O209 Hemorrhage in early pregnancy, unspecified: Secondary | ICD-10-CM | POA: Diagnosis present

## 2019-08-18 DIAGNOSIS — N898 Other specified noninflammatory disorders of vagina: Secondary | ICD-10-CM | POA: Diagnosis not present

## 2019-08-18 DIAGNOSIS — O0281 Inappropriate change in quantitative human chorionic gonadotropin (hCG) in early pregnancy: Secondary | ICD-10-CM

## 2019-08-18 DIAGNOSIS — Z3A13 13 weeks gestation of pregnancy: Secondary | ICD-10-CM | POA: Insufficient documentation

## 2019-08-18 DIAGNOSIS — O3680X0 Pregnancy with inconclusive fetal viability, not applicable or unspecified: Secondary | ICD-10-CM | POA: Insufficient documentation

## 2019-08-18 LAB — HCG, QUANTITATIVE, PREGNANCY: hCG, Beta Chain, Quant, S: 4243 m[IU]/mL — ABNORMAL HIGH (ref <5)

## 2019-08-18 NOTE — Discharge Instructions (Signed)

## 2019-08-18 NOTE — MAU Note (Signed)
Pt is here for repeat HCG. Reports pain in lower abdominal area and back. Seeing some clots of blood. Denies fever, nausea or dizziness.

## 2019-08-18 NOTE — MAU Provider Note (Signed)
History     CSN: [id number]  Arrival date and time: 08/18/19 1017   None     No chief complaint on file.  Bonnie Koch is a 38 y.o. [id number] at [redacted]w[redacted]d who presents today for FU HCG. She was seen 48 hours with HCG around 5000. On 08/14/2019 she had [place] that showed gestational sac without yolk sac or fetal pole. Sizing is not consistent with dates  Vaginal Bleeding The patient's primary symptoms include pelvic pain and vaginal bleeding. This is a new problem. The current episode started in the past 7 days. The problem occurs intermittently. The problem has been unchanged. The pain is moderate. The problem affects both sides. She is pregnant. Pertinent negatives include no chills, fever, nausea or vomiting. The vaginal discharge was bloody. The vaginal bleeding is lighter than menses. She has been passing clots. She has not been passing tissue. Nothing aggravates the symptoms. She has tried nothing for the symptoms.    OB History    Gravida  8   Para  3   Term  3   Preterm      AB  1   Living  5     SAB  1   TAB      Ectopic      Multiple      Live Births  3           Past Medical History:  Diagnosis Date  . Medical history non-contributory     Past Surgical History:  Procedure Laterality Date  . CESAREAN SECTION     x 3    Family History  Problem Relation Age of Onset  . Diabetes Mother     Social History   Tobacco Use  . Smoking status: Never Smoker  . Smokeless tobacco: Never Used  Substance Use Topics  . Alcohol use: Never    Frequency: Never  . Drug use: Never    Allergies: No Known Allergies  Medications Prior to Admission  Medication Sig Dispense Refill Last Dose  . Prenatal Vit-Fe Fumarate-FA (PRENATAL VITAMIN) 27-0.8 MG TABS Take 1 tablet by mouth daily. 30 tablet 11     Review of Systems  Constitutional: Negative for chills and fever.  Gastrointestinal: Negative for nausea and vomiting.  Genitourinary: Positive for pelvic pain  and vaginal bleeding.   Physical Exam   There were no vitals taken for this visit.  Physical Exam  Nursing note and vitals reviewed. Constitutional: She is oriented to person, place, and time. She appears well-developed and well-nourished. No distress.  HENT:  Head: Normocephalic.  Cardiovascular: Normal rate.  Respiratory: Effort normal.  Neurological: She is alert and oriented to person, place, and time.  Psychiatric: She has a normal mood and affect.    Results for HAE, AHLERS (MRN [id number]) as of 08/18/2019 12:01  Ref. Range 08/16/2019 12:41 08/16/2019 13:37 08/18/2019 10:36  HCG, Beta Chain, Quant, S Latest Ref Range: <5 mIU/mL 5,042 (H)  4,243 (H)    MAU Course  Procedures  MDM DW patient that hcg has gone down some, but not by at least 50% today. Due to such a discrepancy between dates and [place], and now with bleeding and HCG slightly down trending that this is likely SAB. Answered questions with interpretor Will have patient repeat HCG in 48 hours to confirm a more significant decline.   Assessment and Plan   1. Pregnancy of unknown anatomic location   2. Inappropriate change in quantitative hCG in early pregnancy  DC home 1st Trimester precautions  Bleeding precautions Ectopic precautions RX: none  FU in 48 hours for repeat HCG   Follow-up Information    [facility] Follow up.   Specialty: Obstetrics and Gynecology Why: Sunday 08/20/2019 for repeat blood work  Contact information: [address] [phone] mc [address] [place] [address] [phone]         Thressa Sheller DNP, CNM  08/18/19  12:06 PM

## 2019-08-20 ENCOUNTER — Other Ambulatory Visit: Payer: Self-pay

## 2019-08-20 ENCOUNTER — Inpatient Hospital Stay (HOSPITAL_COMMUNITY)
Admission: AD | Admit: 2019-08-20 | Discharge: 2019-08-20 | Disposition: A | Discharge status: Home / Self Care | Payer: Medicaid Other | Attending: Obstetrics and Gynecology | Admitting: Obstetrics and Gynecology

## 2019-08-20 DIAGNOSIS — Z3A14 14 weeks gestation of pregnancy: Secondary | ICD-10-CM

## 2019-08-20 DIAGNOSIS — O039 Complete or unspecified spontaneous abortion without complication: Secondary | ICD-10-CM | POA: Insufficient documentation

## 2019-08-20 DIAGNOSIS — Z833 Family history of diabetes mellitus: Secondary | ICD-10-CM | POA: Diagnosis not present

## 2019-08-20 LAB — HCG, QUANTITATIVE, PREGNANCY: hCG, Beta Chain, Quant, S: 3224 m[IU]/mL — ABNORMAL HIGH (ref <5)

## 2019-08-20 NOTE — MAU Note (Signed)
Bonnie Koch is a 38 y.o. at [redacted]w[redacted]d here in MAU reporting: here for follow up hcg. States on Friday she was passing large clots with heavy bleeding, now she is still bleeding a little bit, is wearing a pad and only sees some spotting. No pain today  Pain score: 0/10  Vitals:   08/20/19 1032  BP: (!) 105/50  Pulse: 78  Resp: 16  Temp: 98.1 F (36.7 C)  SpO2: 100%     Lab orders placed from triage: hcg

## 2019-08-20 NOTE — MAU Note (Signed)
Pt called, not in lobby #2

## 2019-08-20 NOTE — MAU Note (Signed)
Informed CNM patient left, states she will release hcg results. RN to take pt off unit census.

## 2019-08-20 NOTE — MAU Note (Signed)
Pt originally wanting results through [facility]. Attempted to confirm email address with patient but patient unsure if email on file is correct. Pt and SO state they will stay on hospital property and return to MAU around 12 for results. CNM informed.

## 2019-08-20 NOTE — MAU Note (Signed)
Pt called, not in lobby 

## 2019-08-20 NOTE — MAU Provider Note (Signed)
History     CSN: [id number]  Arrival date and time: 08/20/19 1015   First Provider Initiated Contact with Patient 08/20/19 1045      Chief Complaint  Patient presents with  . Follow-up   Bonnie Koch is a 38 y.o. [id number] at [redacted]w[redacted]d by LMP.  She presents today for Follow-up quant for definitive diagnosis of SAB.  She reports that she is a little better today.  She reports some vaginal bleeding when using the restroom, but denies cramping.  She states she passed a lot of clots and tissue last week.      OB History    Gravida  8   Para  3   Term  3   Preterm      AB  1   Living  5     SAB  1   TAB      Ectopic      Multiple      Live Births  3           Past Medical History:  Diagnosis Date  . Medical history non-contributory     Past Surgical History:  Procedure Laterality Date  . CESAREAN SECTION     x 3    Family History  Problem Relation Age of Onset  . Diabetes Mother     Social History   Tobacco Use  . Smoking status: Never Smoker  . Smokeless tobacco: Never Used  Substance Use Topics  . Alcohol use: Never    Frequency: Never  . Drug use: Never    Allergies: No Known Allergies  Medications Prior to Admission  Medication Sig Dispense Refill Last Dose  . Prenatal Vit-Fe Fumarate-FA (PRENATAL VITAMIN) 27-0.8 MG TABS Take 1 tablet by mouth daily. 30 tablet 11     Review of Systems  Constitutional: Negative for chills and fever.  Respiratory: Negative for shortness of breath.   Gastrointestinal: Negative for abdominal pain, nausea and vomiting.  Genitourinary: Positive for vaginal bleeding. Negative for difficulty urinating, dysuria and pelvic pain.  Neurological: Negative for dizziness, light-headedness and headaches.   Physical Exam   Blood pressure (!) 105/50, pulse 78, temperature 98.1 F (36.7 C), temperature source Oral, resp. rate 16, SpO2 100 %.  Physical Exam  Constitutional: She is oriented to person, place, and  time. She appears well-developed.  Eyes: Conjunctivae are normal.  Cardiovascular: Normal rate.  Respiratory: Effort normal.  Musculoskeletal: Normal range of motion.  Neurological: She is alert and oriented to person, place, and time.  Psychiatric: She has a normal mood and affect. Her behavior is normal.    MAU Course  Procedures  MDM Education   Assessment and Plan  38 year old  Follow Up Quant SAB  -Patient questions if pregnancy is progressing. -Reviewed Nov 25th [place] which shows failed pregnancy. -Informed patient of results and that she is likely having a miscarriage.  -Patient questions likelihood of failed pregnancy in future reporting recent loss. -Patient informed that provider can not say if next pregnancy will be a failed one.  Discussed how advanced age can cause issues with getting and staying pregnant. -Discussed utilization of infertility specialist if pregnancy is desired. -Informed that results would be sent via mychart with information regarding follow up.  -Reviewed bleeding precautions. -Discussed pelvic rest until cessation of bleeding or 4 weeks whatever occurs first.  -Advised to keep scheduled appt for Medicaid.  -Patient without further questions or concerns. -Encouraged to call or return to MAU if symptoms worsen  or with the onset of new symptoms. -Discharged to home in stable condition.  -All interpretations completed with assistance from May [id number]  Maryann Conners, MSN, CNM 08/20/2019, 10:45 AM   Reassessment (1:50 PM) -Patient returns to MAU for results. -Informed that hCG continues to trend downward. -Condolences given for loss.  -Informed of need for follow up appt in 2-3 weeks at [facility] office with repeat quant. -Patient states she has an appt tomorrow at [facility].  Informed that [facility] may not see her for pregnancy loss, but that she should keep her appt to obtain her medicaid. -Patient questions what her iron level is and what food she can eat to  increase it. -Given information sheet for iron rich diet.  -Patient without further questions or concerns. -Email sent to [place] for scheduling of follow up in 2-3 weeks with repeat quant. -Encouraged to call or return to MAU if symptoms worsen or with the onset of new symptoms.  Maryann Conners MSN, CNM Advanced Practice Provider, [facility]

## 2019-08-20 NOTE — MAU Note (Signed)
Pt called, not in lobby #3

## 2019-08-20 NOTE — Discharge Instructions (Signed)
Miscarriage A miscarriage is the loss of an unborn baby (fetus) before the 20th week of pregnancy. Follow these instructions at home: Medicines   Take over-the-counter and prescription medicines only as told by your doctor.  If you were prescribed antibiotic medicine, take it as told by your doctor. Do not stop taking the antibiotic even if you start to feel better.  Do not take NSAIDs unless your doctor says that this is safe for you. NSAIDs include aspirin and ibuprofen. These medicines can cause bleeding. Activity  Rest as directed. Ask your doctor what activities are safe for you.  Have someone help you at home during this time. General instructions  Write down how many pads you use each day and how soaked they are.  Watch the amount of tissue or clumps of blood (blood clots) that you pass from your vagina. Save any large amounts of tissue for your doctor.  Do not use tampons, douche, or have sex until your doctor approves.  To help you and your partner with the process of grieving, talk with your doctor or seek counseling.  When you are ready, meet with your doctor to talk about steps you should take for your health. Also, talk with your doctor about steps to take to have a healthy pregnancy in the future.  Keep all follow-up visits as told by your doctor. This is important. Contact a doctor if:  You have a fever or chills.  You have vaginal discharge that smells bad.  You have more bleeding. Get help right away if:  You have very bad cramps or pain in your back or belly.  You pass clumps of blood that are walnut-sized or larger from your vagina.  You pass tissue that is walnut-sized or larger from your vagina.  You soak more than 1 regular pad in an hour.  You get light-headed or weak.  You faint (pass out).  You have feelings of sadness that do not go away, or you have thoughts of hurting yourself. Summary  A miscarriage is the loss of an unborn baby before  the 20th week of pregnancy.  Follow your doctor's instructions for home care. Keep all follow-up appointments.  To help you and your partner with the process of grieving, talk with your doctor or seek counseling. This information is not intended to replace advice given to you by your health care provider. Make sure you discuss any questions you have with your health care provider. Document Released: 11/30/2011 Document Revised: 12/30/2018 Document Reviewed: 10/13/2016 Elsevier Patient Education  2020 Elsevier Inc.   Managing Pregnancy Loss Pregnancy loss can happen any time during a pregnancy. Often the cause is not known. It is rarely because of anything you did. Pregnancy loss in early pregnancy (during the first trimester) is called a miscarriage. This type of pregnancy loss is the most common. Pregnancy loss that happens after 20 weeks of pregnancy is called fetal demise if the baby's heart stops beating before birth. Fetal demise is much less common. Some women experience spontaneous labor shortly after fetal demise resulting in a stillborn birth (stillbirth). Any pregnancy loss can be devastating. You will need to recover both physically and emotionally. Most women are able to get pregnant again after a pregnancy loss and deliver a healthy baby. How to manage emotional recovery  Pregnancy loss is very hard emotionally. You may feel many different emotions while you grieve. You may feel sad and angry. You may also feel guilty. It is normal to have  periods of crying. Emotional recovery can take longer than physical recovery. It is different for everyone. Taking these steps can help you in managing this loss:  Remember that it is unlikely you did anything to cause the pregnancy loss.  Share your thoughts and feelings with friends, family, and your partner. Remember that your partner is also recovering emotionally.  Make sure you have a good support system. Do not spend too much time  alone.  Meet with a pregnancy loss counselor or join a pregnancy loss support group.  Get enough sleep and eat a healthy diet. Return to regular exercise when you have recovered physically.  Do not use drugs or alcohol to manage your emotions.  Consider seeing a mental health professional to help you recover emotionally.  Ask a friend or loved one to help you decide what to do with any clothing and nursery items you received for your baby. In the case of a stillbirth, many women benefit from taking additional steps in the grieving process. You may want to:  Hold your baby after the birth.  Name your baby.  Request a birth certificate.  Create a keepsake such as handprints or footprints.  Dress your baby and have a picture taken.  Make funeral arrangements.  Ask for a baptism or blessing. Hospitals have staff members who can help you with all these arrangements. How to recognize emotional stress It is normal to have emotional stress after a pregnancy loss. But emotional stress that lasts a long time or becomes severe requires treatment. Watch out for these signs of severe emotional stress:  Sadness, anger, or guilt that is not going away and is interfering with your normal activities.  Relationship problems that have occurred or gotten worse since the pregnancy loss.  Signs of depression that last longer than 2 weeks. These may include: ? Sadness. ? Anxiety. ? Hopelessness. ? Loss of interest in activities you enjoy. ? Inability to concentrate. ? Trouble sleeping or sleeping too much. ? Loss of appetite or overeating. ? Thoughts of death or of hurting yourself. Follow these instructions at home:  Take over-the-counter and prescription medicines only as told by your health care provider.  Rest at home until your energy level returns. Return to your normal activities as told by your health care provider. Ask your health care provider what activities are safe for  you.  When you are ready, meet with your health care provider to discuss steps to take for a future pregnancy.  Keep all follow-up visits as told by your health care provider. This is important. Where to find support  To help you and your partner with the process of grieving, talk with your health care provider or seek counseling.  Consider meeting with others who have experienced pregnancy loss. Ask your health care provider about support groups and resources. Where to find more information  [place] Department of Health and Cytogeneticist on Women's Health: http://hoffman.com/  American Pregnancy Association: www.americanpregnancy.org Contact a health care provider if:  You continue to experience grief, sadness, or lack of motivation for everyday activities, and those feelings do not improve over time.  You are struggling to recover emotionally, especially if you are using alcohol or substances to help. Get help right away if:  You have thoughts of hurting yourself or others. If you ever feel like you may hurt yourself or others, or have thoughts about taking your own life, get help right away. You can go to your nearest emergency department  or call:  Your local emergency services (911 in the [place]  A suicide crisis helpline, such as the National Suicide Prevention Lifeline at 272-431-0902. This is open 24 hours a day. Summary  Any pregnancy loss can be difficult physically and emotionally.  You may experience many different emotions while you grieve. Emotional recovery can last longer than physical recovery.  It is normal to have emotional stress after a pregnancy loss. But emotional stress that lasts a long time or becomes severe requires treatment.  See your health care provider if you are struggling emotionally after a pregnancy loss. This information is not intended to replace advice given to you by your health care provider. Make sure you discuss any questions you  have with your health care provider. Document Released: 11/18/2017 Document Revised: 12/28/2018 Document Reviewed: 11/18/2017 Elsevier Patient Education  2020 [employer].  Female Infertility  Female infertility refers to a woman's inability to get pregnant (conceive) after a year of having sex regularly (or after 6 months in women over age 47) without using birth control. Infertility can also mean that a woman is not able to carry a pregnancy to full term. Both women and men can have fertility problems. What are the causes? This condition may be caused by:  Problems with reproductive organs. Infertility can result if a woman: ? Has an abnormally short cervix or a cervix that does not remain closed during a pregnancy. ? Has a blockage or scarring in the fallopian tubes. ? Has an abnormally shaped uterus. ? Has uterine fibroids. This is a benign mass of tissue or muscle (tumor) that can develop in the uterus. ? Is not ovulating in a regular way.  Certain medical conditions. These may include: ? Polycystic ovary syndrome (PCOS). This is a hormonal disorder that can cause small cysts to grow on the ovaries. This is the most common cause of infertility in women. ? Endometriosis. This is a condition in which the tissue that lines the uterus (endometrium) grows outside of its normal location. ? Cancer and cancer treatments, such as chemotherapy or radiation. ? Premature ovarian failure. This is when ovaries stop producing eggs and hormones before age 74. ? Sexually transmitted diseases, such as chlamydia or gonorrhea. ? Autoimmune disorders. These are disorders in which the body's defense system (immune system) attacks normal, healthy cells. Infertility can be linked to more than one cause. For some women, the cause of infertility is not known (unexplained infertility). What increases the risk?  Age. A woman's fertility declines with age, especially after her mid-30s.  Being underweight or  overweight.  Drinking too much alcohol.  Using drugs such as anabolic steroids, cocaine, and marijuana.  Exercising excessively.  Being exposed to environmental toxins, such as radiation, pesticides, and certain chemicals. What are the signs or symptoms? The main sign of infertility in women is the inability to get pregnant or carry a pregnancy to full term. How is this diagnosed? This condition may be diagnosed by:  Checking whether you are ovulating each month. The tests may include: ? Blood tests to check hormone levels. ? An ultrasound of the ovaries. ? Taking a small tissue that lines the uterus and checking it under a microscope (endometrial biopsy).  Doing additional tests. This is done if ovulation is normal. Tests may include: ? Hysterosalpingography. This X-ray test can show the shape of the uterus and whether the fallopian tubes are open. ? Laparoscopy. This test uses a lighted tube (laparoscope) to look for problems in the  fallopian tubes and other organs. ? Transvaginal ultrasound. This imaging test is used to check for abnormalities in the uterus and ovaries. ? Hysteroscopy. This test uses a lighted tube to check for problems in the cervix and the uterus. To be diagnosed with infertility, both partners will have a physical exam. Both partners will also have an extensive medical and sexual history taken. Additional tests may be done. How is this treated? Treatment depends on the cause of infertility. Most cases of infertility in women are treated with medicine or surgery.  Women may take medicine to: ? Correct ovulation problems. ? Treat other health conditions.  Surgery may be done to: ? Repair damage to the ovaries, fallopian tubes, cervix, or uterus. ? Remove growths from the uterus. ? Remove scar tissue from the uterus, pelvis, or other organs. Assisted reproductive technology (ART) Assisted reproductive technology (ART) refers to all treatments and procedures  that combine eggs and sperm outside the body to try to help a couple conceive. ART is often combined with fertility drugs to stimulate ovulation. Sometimes ART is done using eggs retrieved from another woman's body (donor eggs) or from previously frozen fertilized eggs (embryos). There are different types of ART. These include:  Intrauterine insemination (IUI). A long, thin tube is used to place sperm directly into a woman's uterus. This procedure: ? Is effective for infertility caused by sperm problems, including low sperm count and low motility. ? Can be used in combination with fertility drugs.  In vitro fertilization (IVF). This is done when a woman's fallopian tubes are blocked or when a man has low sperm count. In this procedure: ? Fertility drugs are used to stimulate the ovaries to produce multiple eggs. ? Once mature, these eggs are removed from the body and combined with the sperm to be fertilized. ? The fertilized eggs are then placed into the woman's uterus. Follow these instructions at home:  Take over-the-counter and prescription medicines only as told by your health care provider.  Do not use any products that contain nicotine or tobacco, such as cigarettes and e-cigarettes. If you need help quitting, ask your health care provider.  If you drink alcohol, limit how much you have to 1 drink a day.  Make dietary changes to lose weight or maintain a healthy weight. Work with your health care provider and a dietitian to set a weight-loss goal that is healthy and reasonable for you.  Seek support from a counselor or support group to talk about your concerns related to infertility. Couples counseling may be helpful for you and your partner.  Practice stress reduction techniques that work well for you, such as regular physical activity, meditation, or deep breathing.  Keep all follow-up visits as told by your health care provider. This is important. Contact a health care provider if  you:  Feel that stress is interfering with your life and relationships.  Have side effects from treatments for infertility. Summary  Female infertility refers to a woman's inability to get pregnant (conceive) after a year of having sex regularly (or after 6 months in women over age 51) without using birth control.  To be diagnosed with infertility, both partners will have a physical exam. Both partners will also have an extensive medical and sexual history taken.  Seek support from a counselor or support group to talk about your concerns related to infertility. Couples counseling may be helpful for you and your partner. This information is not intended to replace advice given to you by  your health care provider. Make sure you discuss any questions you have with your health care provider. Document Released: 09/10/2003 Document Revised: 12/29/2018 Document Reviewed: 08/09/2017 Elsevier Patient Education  [place].  Iron-Rich Diet  Iron is a mineral that helps your body to produce hemoglobin. Hemoglobin is a protein in red blood cells that carries oxygen to your body's tissues. Eating too little iron may cause you to feel weak and tired, and it can increase your risk of infection. Iron is naturally found in many foods, and many foods have iron added to them (iron-fortified foods). You may need to follow an iron-rich diet if you do not have enough iron in your body due to certain medical conditions. The amount of iron that you need each day depends on your age, your sex, and any medical conditions you have. Follow instructions from your health care provider or a diet and nutrition specialist (dietitian) about how much iron you should eat each day. What are tips for following this plan? Reading food labels  Check food labels to see how many milligrams (mg) of iron are in each serving. Cooking  Cook foods in pots and pans that are made from iron.  Take these steps to make it easier  for your body to absorb iron from certain foods: ? Soak beans overnight before cooking. ? Soak whole grains overnight and drain them before using. ? Ferment flours before baking, such as by using yeast in bread dough. Meal planning  When you eat foods that contain iron, you should eat them with foods that are high in vitamin C. These include oranges, peppers, tomatoes, potatoes, and mango. Vitamin C helps your body to absorb iron. General information  Take iron supplements only as told by your health care provider. An overdose of iron can be life-threatening. If you were prescribed iron supplements, take them with orange juice or a vitamin C supplement.  When you eat iron-fortified foods or take an iron supplement, you should also eat foods that naturally contain iron, such as meat, poultry, and fish. Eating naturally iron-rich foods helps your body to absorb the iron that is added to other foods or contained in a supplement.  Certain foods and drinks prevent your body from absorbing iron properly. Avoid eating these foods in the same meal as iron-rich foods or with iron supplements. These foods include: ? Coffee, black tea, and red wine. ? Milk, dairy products, and foods that are high in calcium. ? Beans and soybeans. ? Whole grains. What foods should I eat? Fruits Prunes. Raisins. Eat fruits high in vitamin C, such as oranges, grapefruits, and strawberries, alongside iron-rich foods. Vegetables Spinach (cooked). Green peas. Broccoli. Fermented vegetables. Eat vegetables high in vitamin C, such as leafy greens, potatoes, bell peppers, and tomatoes, alongside iron-rich foods. Grains Iron-fortified breakfast cereal. Iron-fortified whole-wheat bread. Enriched rice. Sprouted grains. Meats and other proteins Beef liver. Oysters. Beef. Shrimp. [place]. Chicken. [place]. Sardines. Chickpeas. Nuts. Tofu. Pumpkin seeds. Beverages Tomato juice. Fresh orange juice. Prune juice. Hibiscus tea.  Fortified instant breakfast shakes. Sweets and desserts Blackstrap molasses. Seasonings and condiments Tahini. Fermented soy sauce. Other foods Wheat germ. The items listed above may not be a complete list of recommended foods and beverages. Contact a dietitian for more information. What foods should I avoid? Grains Whole grains. Bran cereal. Bran flour. Oats. Meats and other proteins Soybeans. Products made from soy protein. Black beans. Lentils. Mung beans. Split peas. Dairy Milk. Cream. Cheese. Yogurt. Cottage cheese. Beverages Coffee.  Black tea. Red wine. Sweets and desserts Cocoa. Chocolate. Ice cream. Other foods Basil. Oregano. Large amounts of parsley. The items listed above may not be a complete list of foods and beverages to avoid. Contact a dietitian for more information. Summary  Iron is a mineral that helps your body to produce hemoglobin. Hemoglobin is a protein in red blood cells that carries oxygen to your body's tissues.  Iron is naturally found in many foods, and many foods have iron added to them (iron-fortified foods).  When you eat foods that contain iron, you should eat them with foods that are high in vitamin C. Vitamin C helps your body to absorb iron.  Certain foods and drinks prevent your body from absorbing iron properly, such as whole grains and dairy products. You should avoid eating these foods in the same meal as iron-rich foods or with iron supplements. This information is not intended to replace advice given to you by your health care provider. Make sure you discuss any questions you have with your health care provider. Document Released: 04/21/2005 Document Revised: 08/20/2017 Document Reviewed: 08/03/2017 Elsevier Patient Education  2020 [employer].

## 2019-09-12 ENCOUNTER — Ambulatory Visit: Payer: Self-pay | Admitting: Obstetrics and Gynecology

## 2019-09-12 ENCOUNTER — Encounter: Payer: Self-pay | Admitting: Obstetrics & Gynecology

## 2020-04-27 IMAGING — US US OB < 14 WEEKS - US OB TV
1 series · 14 of 28 positions shown · non-contrast
Comparison: None.

CLINICAL DATA: Vaginal bleeding

EXAM:
OBSTETRIC <14 WK US AND TRANSVAGINAL OB US
TECHNIQUE: Both transabdominal and transvaginal ultrasound examinations were
performed for complete evaluation of the gestation as well as the
maternal uterus, adnexal regions, and pelvic cul-de-sac.
Transvaginal technique was performed to assess early pregnancy.

[Series 1: us ob < 14 weeks - us ob tv · 86 acquisitions, 14 frames shown]
[im 4/86]
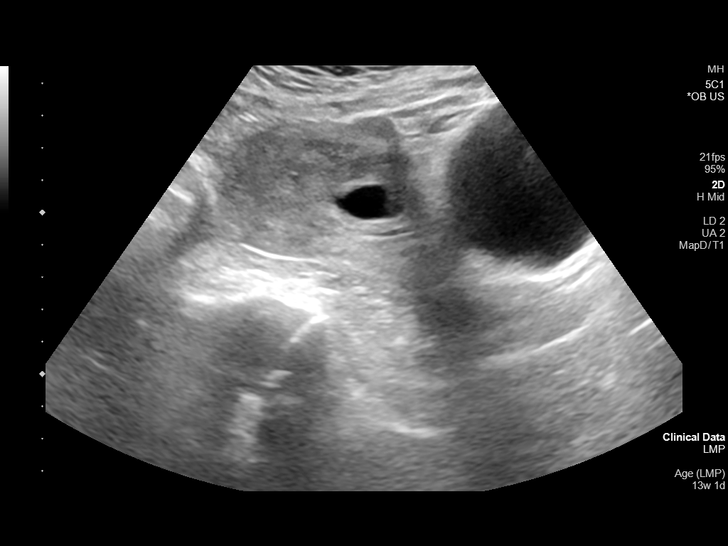
[im 10/86]
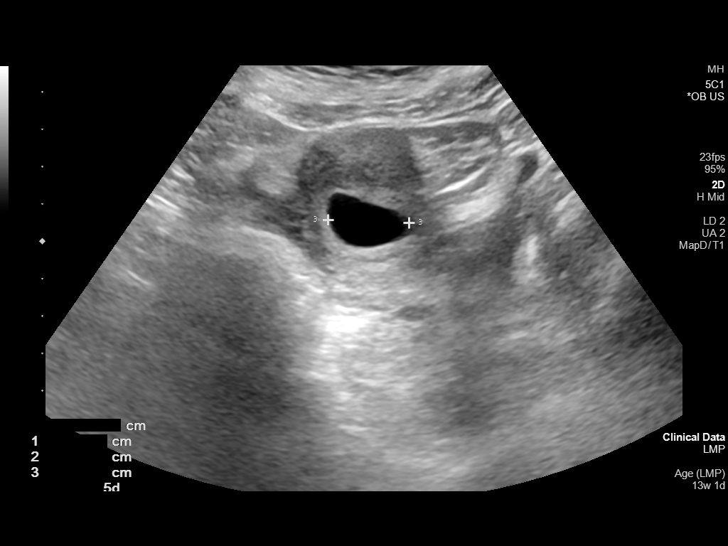
[im 16/86]
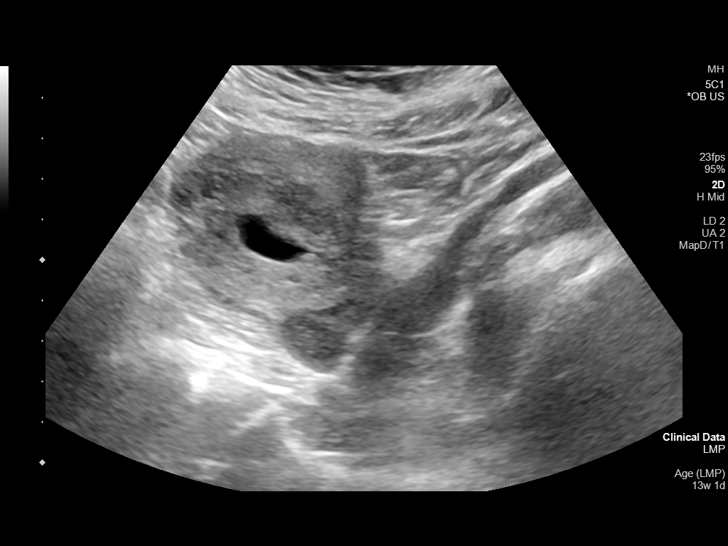
[im 23/86]
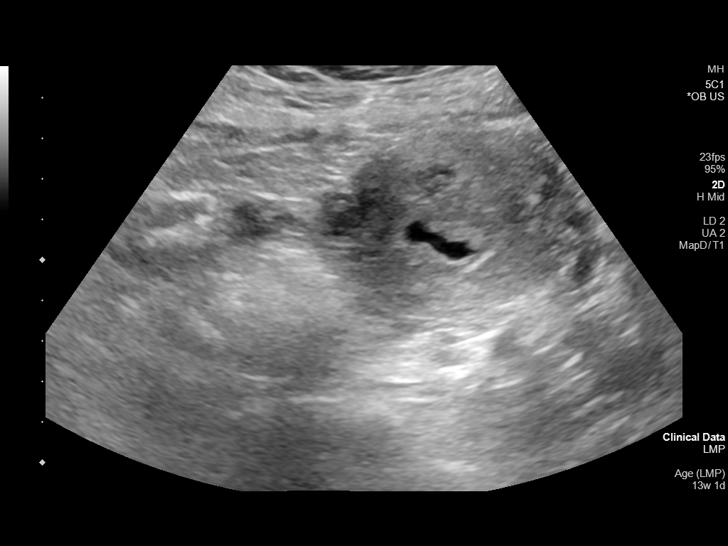
[im 29/86]
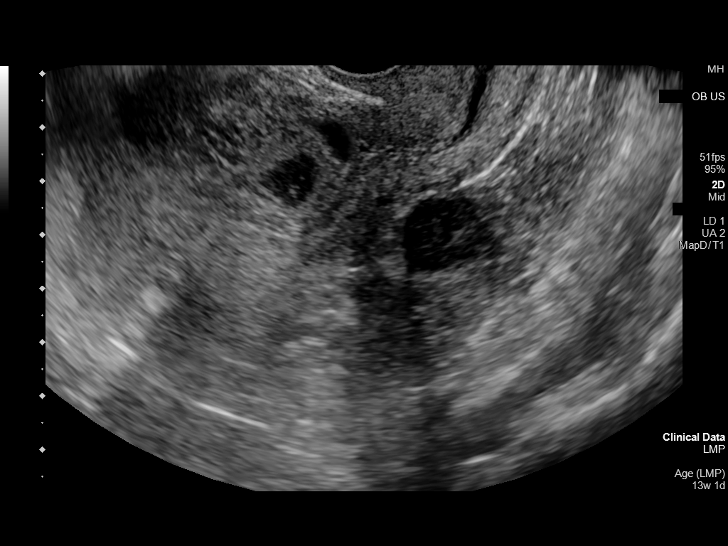
[im 35/86]
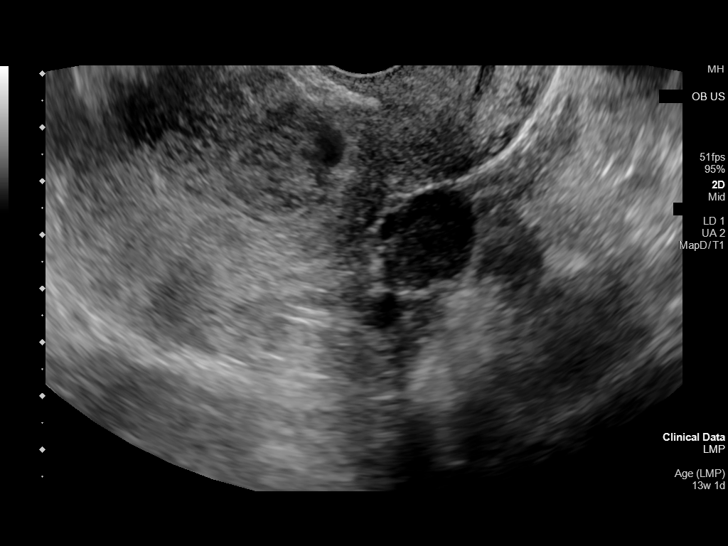
[im 41/86]
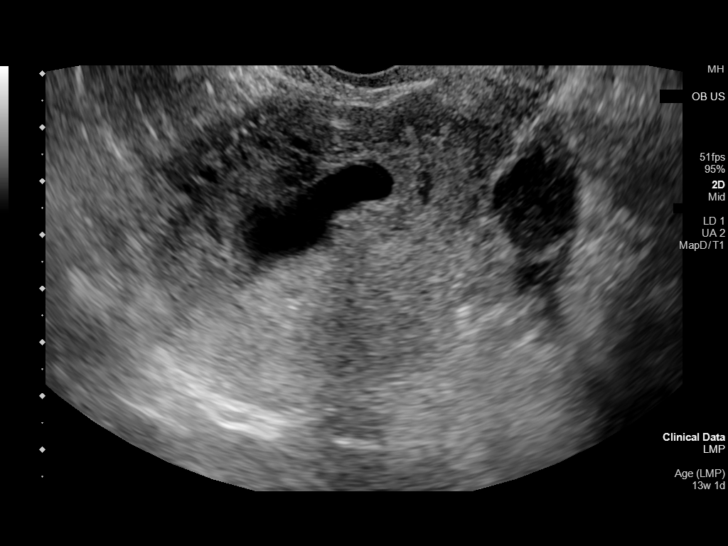
[im 48/86]
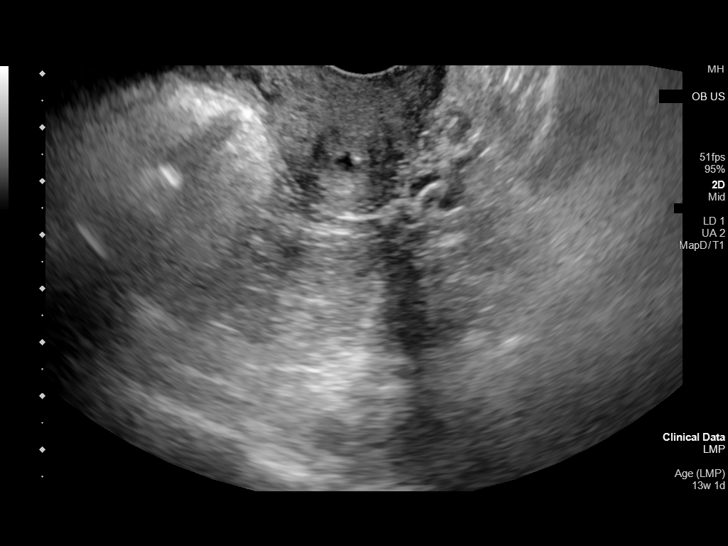
[im 54/86]
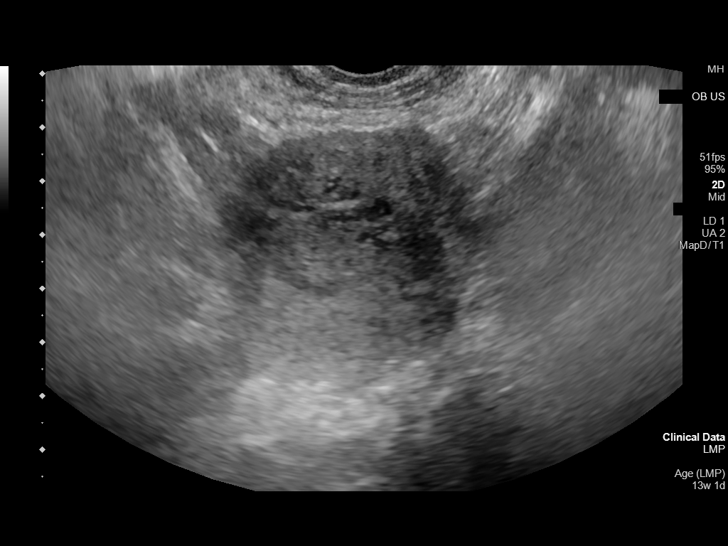
[im 60/86]
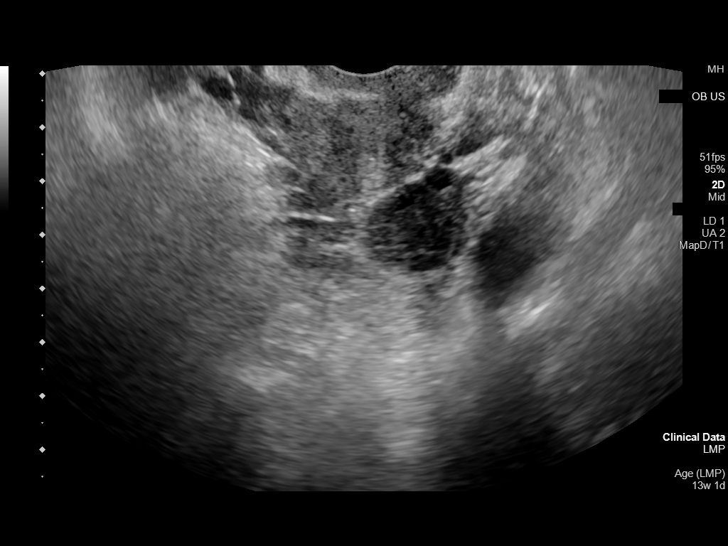
[im 67/86]
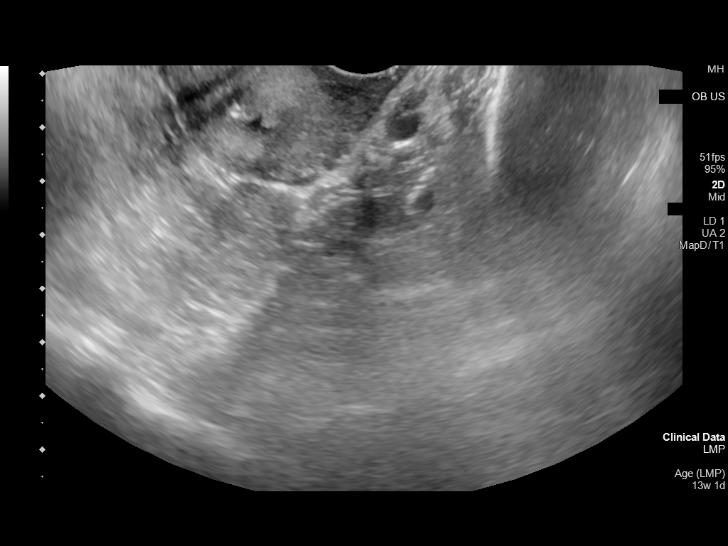
[im 73/86]
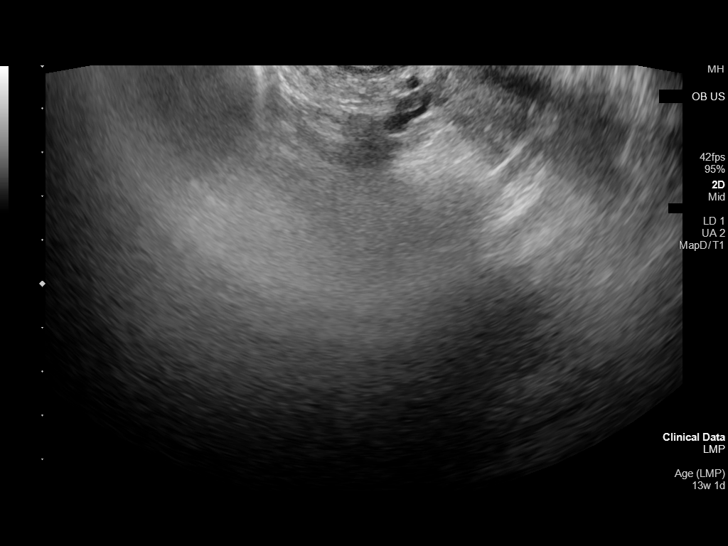
[im 79/86]
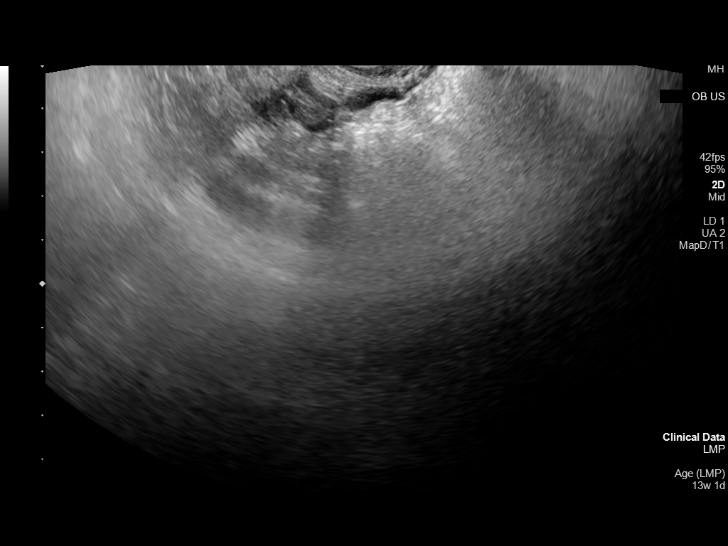
[im 86/86]
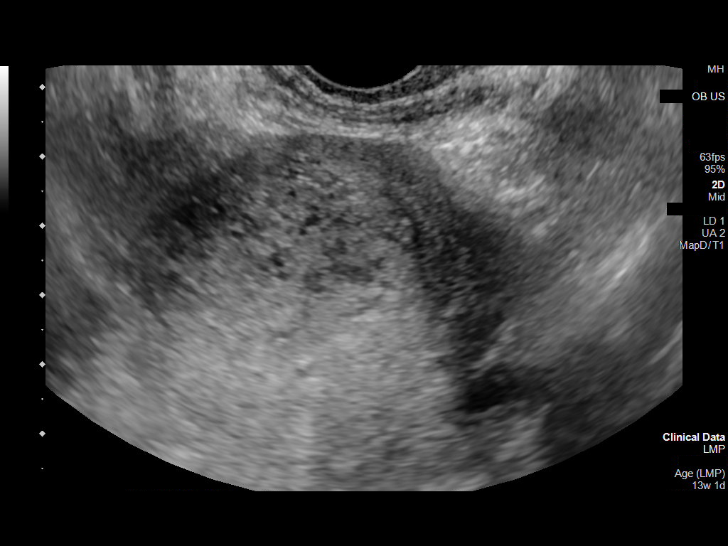

[14 of 28 positions shown; findings below may reference images not displayed]

FINDINGS: Intrauterine gestational sac: Single

Yolk sac:  Not Visualized.

Embryo:  Not Visualized.

Cardiac Activity: Not Visualized.

MSD: 23.3 mm   7 w   0 d

Subchorionic hemorrhage: Small subchorionic hemorrhage

Maternal uterus/adnexae: Left ovary measures 2.1 x 1.5 x 1.8 cm. The
right ovary is nonvisualized.
IMPRESSION: 1. Single intrauterine gestational sac with mean sac diameter of
23.3 mm but negative for embryo or yolk sac. Findings are suspicious
but not yet definitive for failed pregnancy. Recommend follow-up US
in 10-14 days for definitive diagnosis. This recommendation follows
[facility] consensus guidelines: Diagnostic Criteria for Nonviable
Pregnancy Early in the First Trimester. N Engl J Med 9615;
2. Small subchorionic hemorrhage

## 2020-04-29 IMAGING — US US OB TRANSVAGINAL
2 series · 15 of 28 positions shown · non-contrast
Comparison: 08/14/2019

CLINICAL DATA: Pregnant, vaginal bleeding

EXAM:
TRANSVAGINAL OB ULTRASOUND
TECHNIQUE: Transvaginal ultrasound was performed for complete evaluation of the
gestation as well as the maternal uterus, adnexal regions, and
pelvic cul-de-sac.

[Series 1: us ob transvaginal · 37 acquisitions, 14 frames shown (1 of 2)]
[im 1/37]
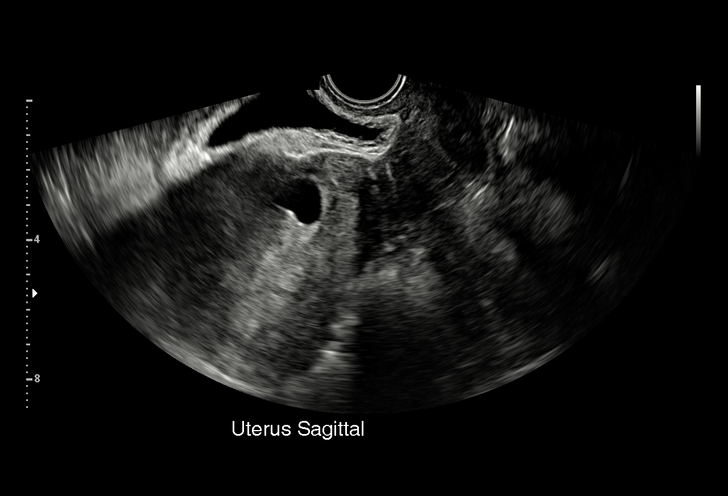
[im 3/37]
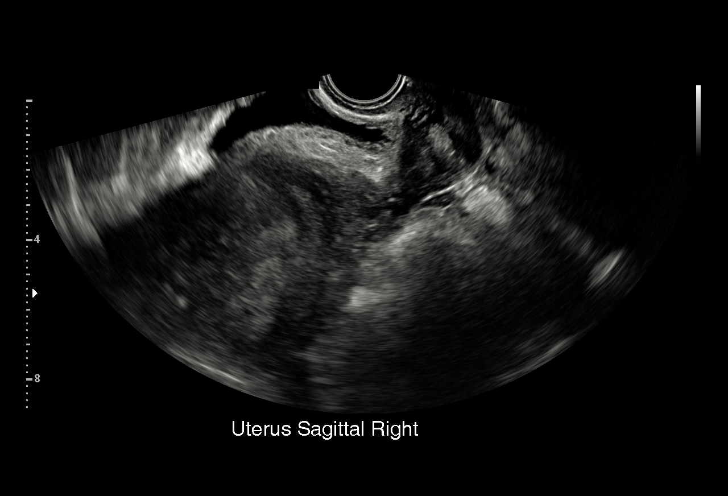
[im 6/37]
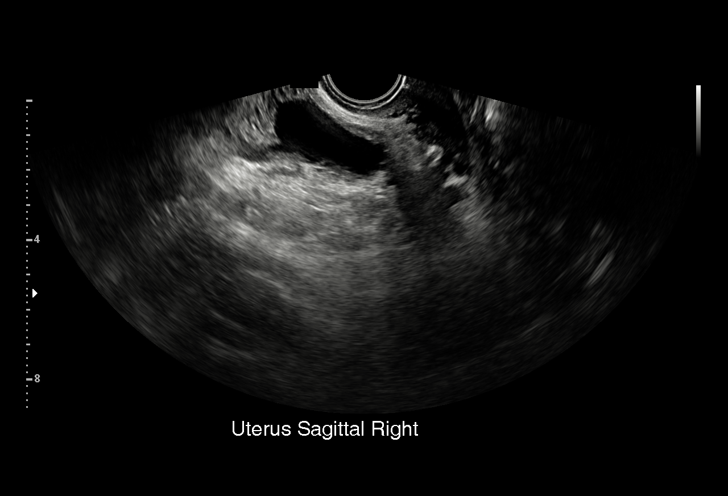
[im 9/37]
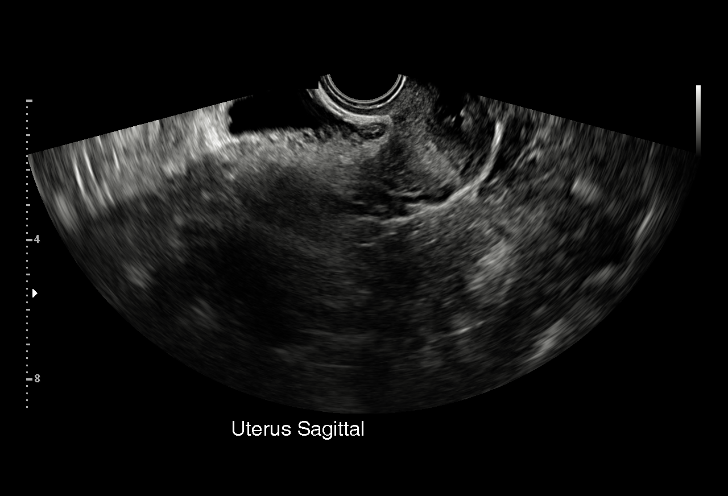
[im 12/37]
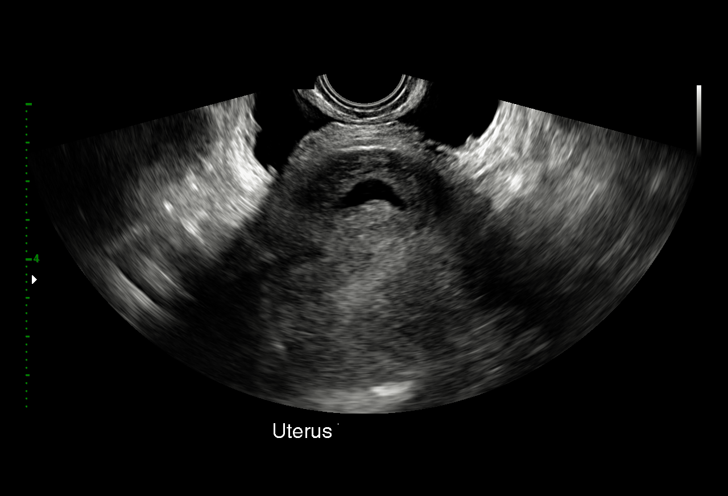
[im 14/37]
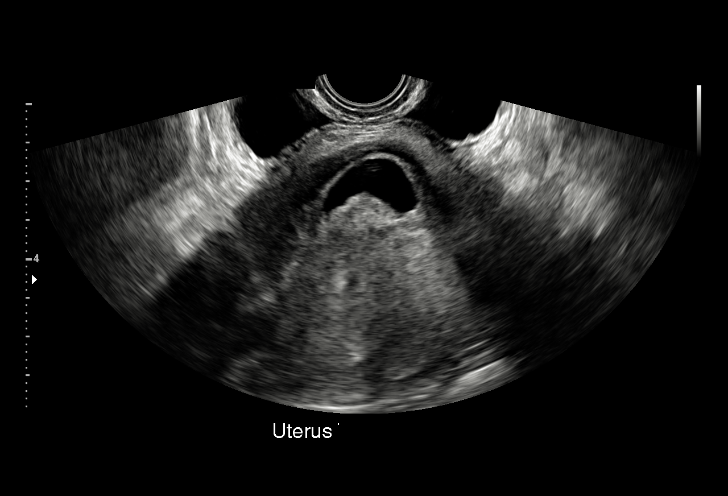
[im 17/37]
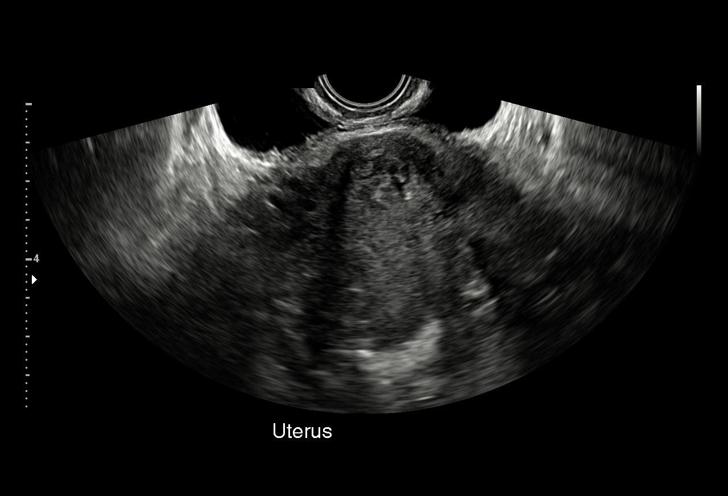
[im 20/37]
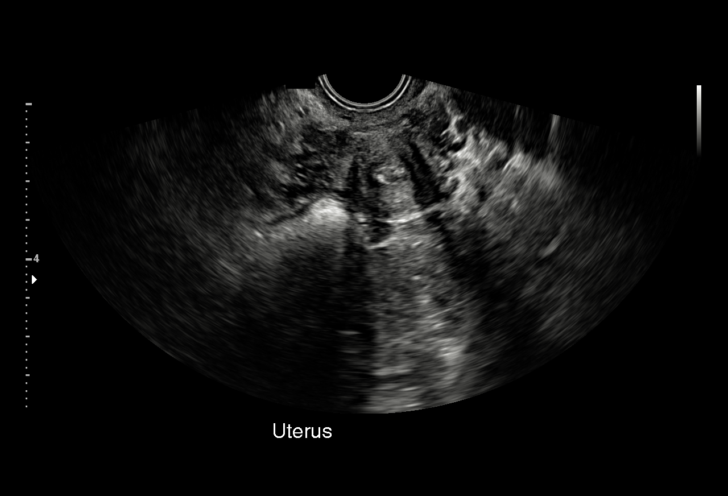
[im 21/37]
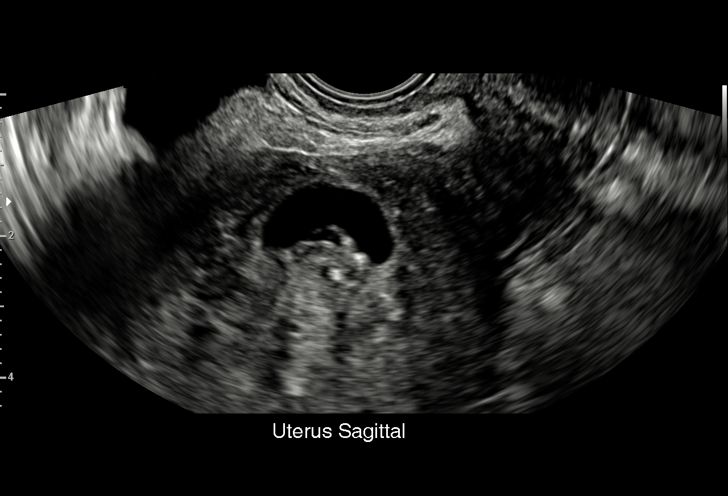
[im 24/37]
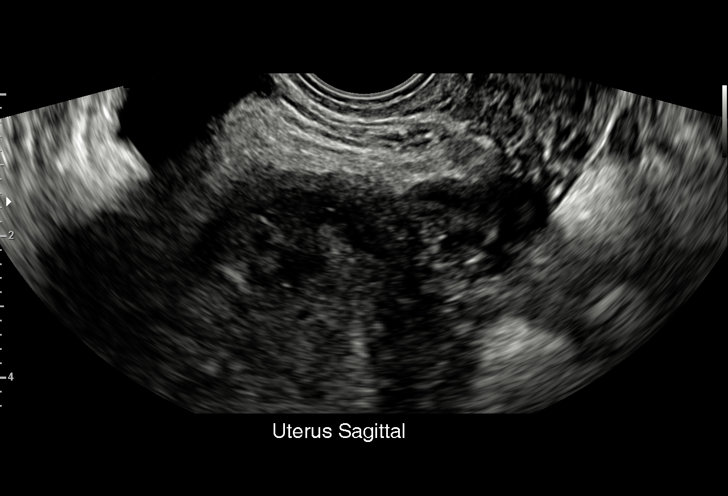
[im 27/37]
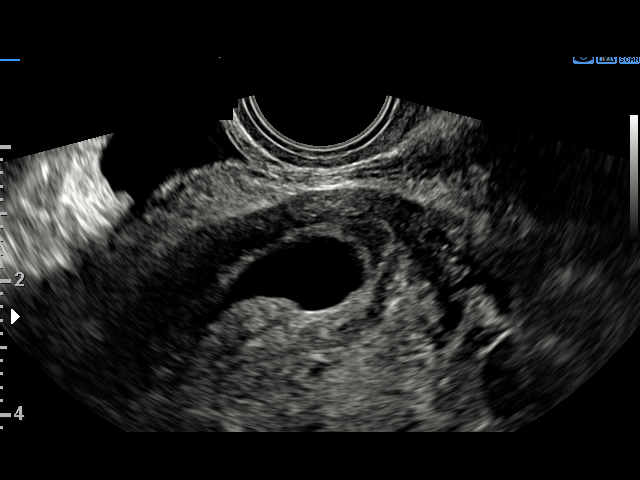
[im 30/37]
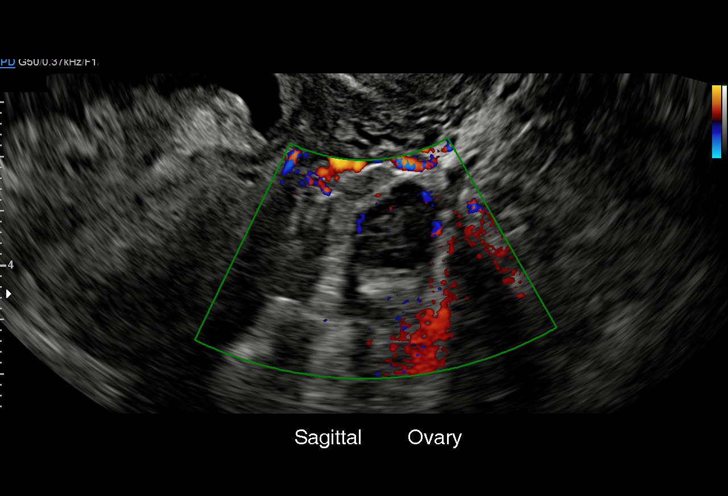
[im 32/37]
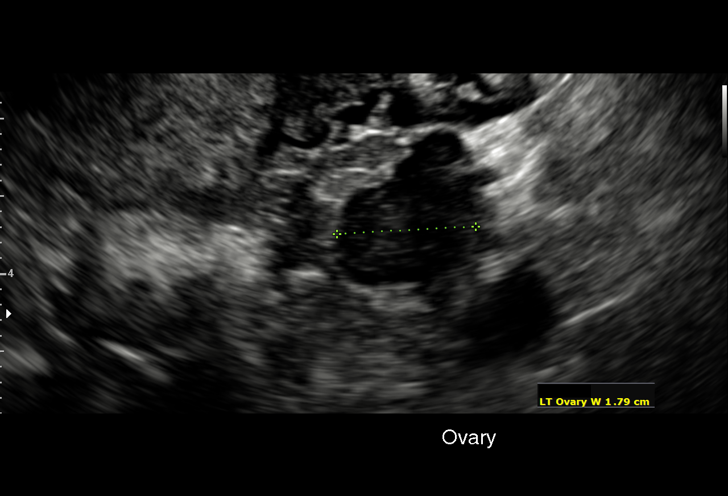
[im 35/37]
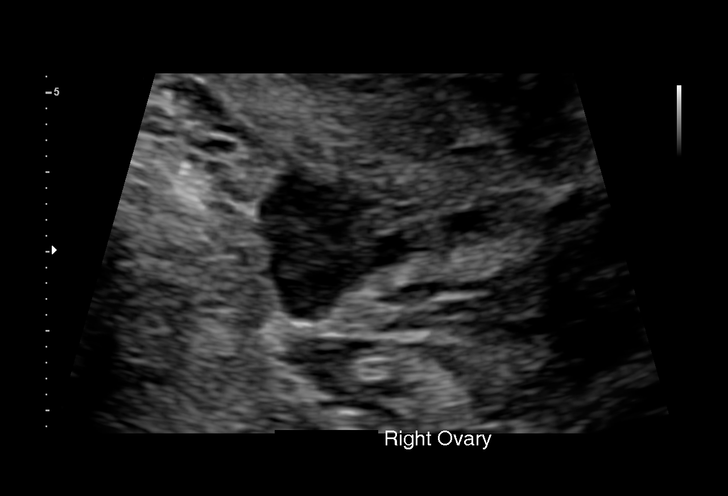

[Series 2: us ob transvaginal · 1 of 2 slices shown (2 of 2)]
[im 1/2]
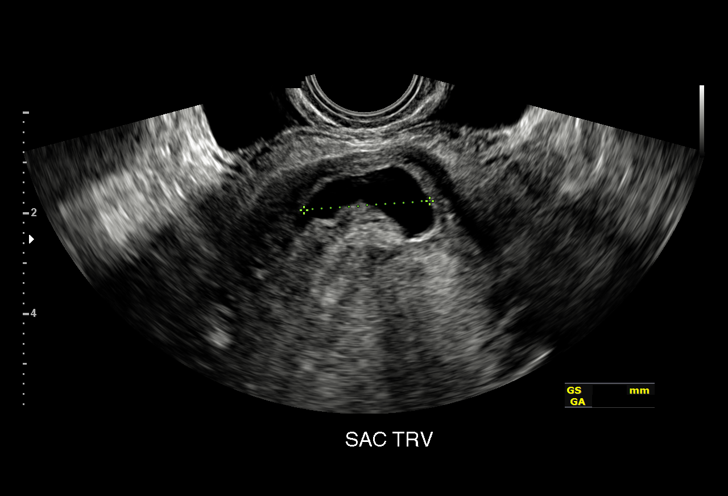

[15 of 28 positions shown; findings below may reference images not displayed]

FINDINGS: Intrauterine gestational sac: Single, irregular with angular
margins, located in the lower uterine segment

Yolk sac:  Not Visualized.

Embryo:  Not Visualized.

MSD: 18.7 mm   6 w   5 d

Subchorionic hemorrhage:  None visualized.

Maternal uterus/adnexae: Bilateral ovaries are within normal limits.

No free fluid.
IMPRESSION: Single irregular gestational sac located within the lower uterine
segment. No yolk sac or fetal pole. No appropriate growth from prior
study. This appearance continues to be highly suspicious for failed
pregnancy.

Findings are suspicious but not yet definitive for failed pregnancy.
Recommend follow-up US in 10-14 days for definitive diagnosis. This
recommendation follows [facility] consensus guidelines: Diagnostic Criteria
for Nonviable Pregnancy Early in the First Trimester. N Engl J Med
